# Patient Record
Sex: Female | Born: 1980 | Race: Black or African American | Hispanic: No | Marital: Single | Smoking: Never smoker
Health system: Southern US, Community
[De-identification: ages and names within clinical notes are randomized; demographics above are authoritative.]

## PROBLEM LIST (undated history)

## (undated) DIAGNOSIS — R7303 Prediabetes: Secondary | ICD-10-CM

## (undated) DIAGNOSIS — E559 Vitamin D deficiency, unspecified: Secondary | ICD-10-CM

## (undated) DIAGNOSIS — E119 Type 2 diabetes mellitus without complications: Secondary | ICD-10-CM

## (undated) DIAGNOSIS — E079 Disorder of thyroid, unspecified: Secondary | ICD-10-CM

## (undated) HISTORY — PX: ABDOMINAL HYSTERECTOMY: SHX81

---

## 2008-05-07 ENCOUNTER — Emergency Department (HOSPITAL_BASED_OUTPATIENT_CLINIC_OR_DEPARTMENT_OTHER): Admission: EM | Admit: 2008-05-07 | Discharge: 2008-05-07 | Payer: Self-pay | Admitting: Emergency Medicine

## 2009-01-01 ENCOUNTER — Emergency Department (HOSPITAL_BASED_OUTPATIENT_CLINIC_OR_DEPARTMENT_OTHER): Admission: EM | Admit: 2009-01-01 | Discharge: 2009-01-01 | Payer: Self-pay | Admitting: Emergency Medicine

## 2009-12-20 ENCOUNTER — Ambulatory Visit: Payer: Self-pay | Admitting: Diagnostic Radiology

## 2009-12-20 ENCOUNTER — Emergency Department (HOSPITAL_BASED_OUTPATIENT_CLINIC_OR_DEPARTMENT_OTHER): Admission: EM | Admit: 2009-12-20 | Discharge: 2009-12-20 | Payer: Self-pay | Admitting: Emergency Medicine

## 2009-12-29 ENCOUNTER — Emergency Department (HOSPITAL_BASED_OUTPATIENT_CLINIC_OR_DEPARTMENT_OTHER): Admission: EM | Admit: 2009-12-29 | Discharge: 2009-12-29 | Payer: Self-pay | Admitting: Emergency Medicine

## 2010-03-22 ENCOUNTER — Emergency Department (HOSPITAL_BASED_OUTPATIENT_CLINIC_OR_DEPARTMENT_OTHER)
Admission: EM | Admit: 2010-03-22 | Discharge: 2010-03-22 | Payer: Self-pay | Source: Home / Self Care | Admitting: Emergency Medicine

## 2010-06-14 LAB — RAPID STREP SCREEN (MED CTR MEBANE ONLY): Streptococcus, Group A Screen (Direct): NEGATIVE

## 2010-06-17 LAB — CBC
MCH: 25.3 pg — ABNORMAL LOW (ref 26.0–34.0)
MCHC: 32.7 g/dL (ref 30.0–36.0)
Platelets: 339 10*3/uL (ref 150–400)
RDW: 14.2 % (ref 11.5–15.5)

## 2010-06-17 LAB — URINALYSIS, ROUTINE W REFLEX MICROSCOPIC
Nitrite: NEGATIVE
Specific Gravity, Urine: 1.022 (ref 1.005–1.030)
Urobilinogen, UA: 1 mg/dL (ref 0.0–1.0)

## 2010-06-17 LAB — URINE MICROSCOPIC-ADD ON

## 2010-06-17 LAB — BASIC METABOLIC PANEL
CO2: 27 mEq/L (ref 19–32)
Calcium: 9.4 mg/dL (ref 8.4–10.5)
Creatinine, Ser: 0.9 mg/dL (ref 0.4–1.2)
Glucose, Bld: 100 mg/dL — ABNORMAL HIGH (ref 70–99)

## 2010-06-17 LAB — DIFFERENTIAL
Eosinophils Absolute: 0 10*3/uL (ref 0.0–0.7)
Monocytes Absolute: 0.8 10*3/uL (ref 0.1–1.0)
Neutrophils Relative %: 82 % — ABNORMAL HIGH (ref 43–77)

## 2010-06-17 LAB — PREGNANCY, URINE: Preg Test, Ur: NEGATIVE

## 2010-06-20 ENCOUNTER — Emergency Department (INDEPENDENT_AMBULATORY_CARE_PROVIDER_SITE_OTHER): Payer: Self-pay

## 2010-06-20 ENCOUNTER — Emergency Department (HOSPITAL_BASED_OUTPATIENT_CLINIC_OR_DEPARTMENT_OTHER)
Admission: EM | Admit: 2010-06-20 | Discharge: 2010-06-20 | Disposition: A | Discharge status: Home / Self Care | Payer: Self-pay | Attending: Emergency Medicine | Admitting: Emergency Medicine

## 2010-06-20 DIAGNOSIS — M7989 Other specified soft tissue disorders: Secondary | ICD-10-CM

## 2010-06-20 DIAGNOSIS — M79609 Pain in unspecified limb: Secondary | ICD-10-CM

## 2010-06-20 DIAGNOSIS — M25579 Pain in unspecified ankle and joints of unspecified foot: Secondary | ICD-10-CM | POA: Insufficient documentation

## 2010-07-09 LAB — TSH: TSH: 4.089 u[IU]/mL (ref 0.350–4.500)

## 2010-07-09 LAB — BASIC METABOLIC PANEL
CO2: 28 mEq/L (ref 19–32)
Calcium: 9.5 mg/dL (ref 8.4–10.5)
Creatinine, Ser: 0.8 mg/dL (ref 0.4–1.2)
GFR calc Af Amer: >60 mL/min (ref >60)

## 2010-07-09 LAB — POCT CARDIAC MARKERS: Myoglobin, poc: 41.4 ng/mL (ref 12–200)

## 2010-07-20 LAB — WET PREP, GENITAL
Clue Cells Wet Prep HPF POC: NONE SEEN
Trich, Wet Prep: NONE SEEN
WBC, Wet Prep HPF POC: NONE SEEN
Yeast Wet Prep HPF POC: NONE SEEN

## 2010-07-20 LAB — CBC
Hemoglobin: 11.1 g/dL — ABNORMAL LOW (ref 12.0–15.0)
Platelets: 359 10*3/uL (ref 150–400)
RDW: 14.2 % (ref 11.5–15.5)

## 2010-07-20 LAB — DIFFERENTIAL
Basophils Absolute: 0.1 10*3/uL (ref 0.0–0.1)
Lymphocytes Relative: 40 % (ref 12–46)
Neutro Abs: 4.3 10*3/uL (ref 1.7–7.7)
Neutrophils Relative %: 51 % (ref 43–77)

## 2010-07-20 LAB — URINALYSIS, ROUTINE W REFLEX MICROSCOPIC
Glucose, UA: NEGATIVE mg/dL
Ketones, ur: NEGATIVE mg/dL
pH: 6.5 (ref 5.0–8.0)

## 2010-07-20 LAB — URINE MICROSCOPIC-ADD ON

## 2011-03-31 ENCOUNTER — Encounter: Payer: Self-pay | Admitting: *Deleted

## 2011-03-31 DIAGNOSIS — X58XXXA Exposure to other specified factors, initial encounter: Secondary | ICD-10-CM | POA: Insufficient documentation

## 2011-03-31 DIAGNOSIS — IMO0002 Reserved for concepts with insufficient information to code with codable children: Secondary | ICD-10-CM | POA: Insufficient documentation

## 2011-03-31 DIAGNOSIS — R51 Headache: Secondary | ICD-10-CM | POA: Insufficient documentation

## 2011-03-31 DIAGNOSIS — R42 Dizziness and giddiness: Secondary | ICD-10-CM | POA: Insufficient documentation

## 2011-03-31 NOTE — ED Notes (Signed)
Pt presents to ED today with low back pain and dizziness.  Pt aslo c/o HA for the last 3 days

## 2011-04-01 ENCOUNTER — Emergency Department (HOSPITAL_BASED_OUTPATIENT_CLINIC_OR_DEPARTMENT_OTHER)
Admission: EM | Admit: 2011-04-01 | Discharge: 2011-04-01 | Disposition: A | Discharge status: Home / Self Care | Payer: Self-pay | Attending: Emergency Medicine | Admitting: Emergency Medicine

## 2011-04-01 DIAGNOSIS — S39012A Strain of muscle, fascia and tendon of lower back, initial encounter: Secondary | ICD-10-CM

## 2011-04-01 LAB — URINE MICROSCOPIC-ADD ON

## 2011-04-01 LAB — URINALYSIS, ROUTINE W REFLEX MICROSCOPIC
Bilirubin Urine: NEGATIVE
Glucose, UA: NEGATIVE mg/dL
Ketones, ur: NEGATIVE mg/dL
Specific Gravity, Urine: 1.024 (ref 1.005–1.030)
pH: 6 (ref 5.0–8.0)

## 2011-04-01 MED ORDER — HYDROCODONE-ACETAMINOPHEN 5-325 MG PO TABS: 2.0000 | ORAL_TABLET | ORAL | Status: AC | PRN

## 2011-04-01 MED ORDER — IBUPROFEN 800 MG PO TABS: 800.0000 mg | ORAL_TABLET | Freq: Three times a day (TID) | ORAL | Status: AC

## 2011-04-01 NOTE — ED Provider Notes (Signed)
History     CSN: [id number]  Arrival date & time 03/31/11  2325   First MD Initiated Contact with Patient 04/01/11 [phone]      Chief Complaint  Patient presents with  . Headache  . Back Pain    (Consider location/radiation/quality/duration/timing/severity/associated sxs/prior treatment) HPI Comments: Pt with 2 day hx of LBP, non-radiating, no neuro deficits.  No injury.  Earlier today, had frontal headache, has been off/on for 3 days.  Had some dizziness while taking a shower earlier today.  No dizziness, only mild headache now.  No recent illnesses, no fever.  No neck pain.  No CP/SOB  Patient is a 30 y.o. female presenting with back pain.  Back Pain  This is a new problem. The current episode started 2 days ago. The problem occurs constantly. The problem has been gradually worsening. The pain is associated with no known injury. The pain is present in the lumbar spine. The quality of the pain is described as shooting and aching. The pain does not radiate. The pain is moderate. The symptoms are aggravated by certain positions. Associated symptoms include headaches. Pertinent negatives include no chest pain, no fever, no numbness, no abdominal pain, no bowel incontinence, no bladder incontinence, no dysuria, no paresthesias, no tingling and no weakness.    History reviewed. No pertinent past medical history.  History reviewed. No pertinent past surgical history.  No family history on file.  History  Substance Use Topics  . Smoking status: Never Smoker   . Smokeless tobacco: Not on file  . Alcohol Use: No    OB History    Grav Para Term Preterm Abortions TAB SAB Ect Mult Living                  Review of Systems  Constitutional: Negative for fever, chills, diaphoresis and fatigue.  HENT: Negative for congestion, rhinorrhea and sneezing.   Eyes: Negative.   Respiratory: Negative for cough, chest tightness and shortness of breath.   Cardiovascular: Negative for chest pain  and leg swelling.  Gastrointestinal: Negative for nausea, vomiting, abdominal pain, diarrhea, blood in stool and bowel incontinence.  Genitourinary: Negative for bladder incontinence, dysuria, frequency, hematuria, flank pain and difficulty urinating.  Musculoskeletal: Positive for back pain. Negative for arthralgias.  Skin: Negative for rash.  Neurological: Positive for dizziness and headaches. Negative for tingling, speech difficulty, weakness, numbness and paresthesias.    Allergies  Review of patient's allergies indicates no known allergies.  Home Medications   Current Outpatient Rx  Name Route Sig Dispense Refill  . PROGESTERONE MICRONIZED 100 MG PO CAPS Oral Take 100 mg by mouth daily.      Marland Kitchen HYDROCODONE-ACETAMINOPHEN 5-325 MG PO TABS Oral Take 2 tablets by mouth every 4 (four) hours as needed for pain. 15 tablet 0  . IBUPROFEN 800 MG PO TABS Oral Take 1 tablet (800 mg total) by mouth 3 (three) times daily. 21 tablet 0    BP 122/73  Pulse 80  Temp(Src) 97.4 F (36.3 C) (Oral)  Resp 20  Ht 5' (1.524 m)  Wt 253 lb (114.76 kg)  BMI 49.41 kg/m2  SpO2 99%  Physical Exam  Constitutional: She is oriented to person, place, and time. She appears well-developed and well-nourished.  HENT:  Head: Normocephalic and atraumatic.  Eyes: Pupils are equal, round, and reactive to light.  Neck: Normal range of motion. Neck supple.  Cardiovascular: Normal rate, regular rhythm and normal heart sounds.   Pulmonary/Chest: Effort normal and breath sounds  normal. No respiratory distress. She has no wheezes. She has no rales. She exhibits no tenderness.  Abdominal: Soft. Bowel sounds are normal. There is no tenderness. There is no rebound and no guarding.  Musculoskeletal: Normal range of motion. She exhibits no edema.       Mild pain to musculature bilaterally in mid lumbar spine.  No pain along spine itself.  Neg SLR bilaterally.  NV intact  Lymphadenopathy:    She has no cervical adenopathy.    Neurological: She is alert and oriented to person, place, and time. She has normal strength. No cranial nerve deficit or sensory deficit. Coordination normal.  Skin: Skin is warm and dry. No rash noted.  Psychiatric: She has a normal mood and affect.    ED Course  Procedures (including critical care time)  Results for orders placed during the hospital encounter of 04/01/11  URINALYSIS, ROUTINE W REFLEX MICROSCOPIC      Component Value Range   Color, Urine YELLOW  YELLOW    APPearance CLEAR  CLEAR    Specific Gravity, Urine 1.024  1.005 - 1.030    pH 6.0  5.0 - 8.0    Glucose, UA NEGATIVE  NEGATIVE (mg/dL)   Hgb urine dipstick SMALL (*) NEGATIVE    Bilirubin Urine NEGATIVE  NEGATIVE    Ketones, ur NEGATIVE  NEGATIVE (mg/dL)   Protein, ur NEGATIVE  NEGATIVE (mg/dL)   Urobilinogen, UA 0.2  0.0 - 1.0 (mg/dL)   Nitrite NEGATIVE  NEGATIVE    Leukocytes, UA NEGATIVE  NEGATIVE   URINE MICROSCOPIC-ADD ON      Component Value Range   Squamous Epithelial / LPF FEW (*) RARE    WBC, UA 0-2  <3 (WBC/hpf)   RBC / HPF 0-2  <3 (RBC/hpf)   Bacteria, UA FEW (*) RARE   PREGNANCY, URINE      Component Value Range   Preg Test, Ur NEGATIVE     No results found.   1. Back strain       MDM  Pt with back pain, likely musculoskeletal,  No UTI.  No abd pain.  No significant headache/dizziness now.  Advised pt to f/u with PMD        Rolan Bucco, MD 04/01/11 [phone]

## 2012-04-19 DIAGNOSIS — C541 Malignant neoplasm of endometrium: Secondary | ICD-10-CM | POA: Insufficient documentation

## 2012-04-28 ENCOUNTER — Encounter (HOSPITAL_BASED_OUTPATIENT_CLINIC_OR_DEPARTMENT_OTHER): Payer: Self-pay | Admitting: *Deleted

## 2012-04-28 ENCOUNTER — Emergency Department (HOSPITAL_BASED_OUTPATIENT_CLINIC_OR_DEPARTMENT_OTHER)
Admission: EM | Admit: 2012-04-28 | Discharge: 2012-04-28 | Disposition: A | Discharge status: Home / Self Care | Payer: BC Managed Care – PPO | Attending: Emergency Medicine | Admitting: Emergency Medicine

## 2012-04-28 DIAGNOSIS — Z90710 Acquired absence of both cervix and uterus: Secondary | ICD-10-CM | POA: Insufficient documentation

## 2012-04-28 DIAGNOSIS — R1032 Left lower quadrant pain: Secondary | ICD-10-CM | POA: Insufficient documentation

## 2012-04-28 DIAGNOSIS — M793 Panniculitis, unspecified: Secondary | ICD-10-CM | POA: Insufficient documentation

## 2012-04-28 DIAGNOSIS — R51 Headache: Secondary | ICD-10-CM | POA: Insufficient documentation

## 2012-04-28 LAB — CBC WITH DIFFERENTIAL/PLATELET
Basophils Absolute: 0 10*3/uL (ref 0.0–0.1)
Lymphocytes Relative: 11 % — ABNORMAL LOW (ref 12–46)
Lymphs Abs: 1.6 10*3/uL (ref 0.7–4.0)
MCV: 78.9 fL (ref 78.0–100.0)
Neutro Abs: 12.2 10*3/uL — ABNORMAL HIGH (ref 1.7–7.7)
Neutrophils Relative %: 85 % — ABNORMAL HIGH (ref 43–77)
Platelets: 338 10*3/uL (ref 150–400)
RBC: 4.13 MIL/uL (ref 3.87–5.11)
RDW: 15.6 % — ABNORMAL HIGH (ref 11.5–15.5)
WBC: 14.4 10*3/uL — ABNORMAL HIGH (ref 4.0–10.5)

## 2012-04-28 LAB — BASIC METABOLIC PANEL
CO2: 25 mEq/L (ref 19–32)
Calcium: 9.3 mg/dL (ref 8.4–10.5)
Chloride: 101 mEq/L (ref 96–112)
Glucose, Bld: 109 mg/dL — ABNORMAL HIGH (ref 70–99)
Potassium: 3.4 mEq/L — ABNORMAL LOW (ref 3.5–5.1)
Sodium: 139 mEq/L (ref 135–145)

## 2012-04-28 MED ORDER — MORPHINE SULFATE 4 MG/ML IJ SOLN
4.0000 mg | Freq: Once | INTRAMUSCULAR | Status: AC
  Administered 2012-04-28: 4 mg via INTRAVENOUS
  Filled 2012-04-28: qty 1

## 2012-04-28 MED ORDER — SODIUM CHLORIDE 0.9 % IV SOLN
Freq: Once | INTRAVENOUS | Status: AC
  Administered 2012-04-28: 17:00:00 via INTRAVENOUS

## 2012-04-28 MED ORDER — CLINDAMYCIN HCL 150 MG PO CAPS: 300.0000 mg | ORAL_CAPSULE | Freq: Three times a day (TID) | ORAL | Status: DC

## 2012-04-28 MED ORDER — CLINDAMYCIN PHOSPHATE 900 MG/50ML IV SOLN
900.0000 mg | Freq: Once | INTRAVENOUS | Status: AC
  Administered 2012-04-28: 900 mg via INTRAVENOUS
  Filled 2012-04-28: qty 50

## 2012-04-28 MED ORDER — KETOROLAC TROMETHAMINE 30 MG/ML IJ SOLN
30.0000 mg | Freq: Once | INTRAMUSCULAR | Status: AC
  Administered 2012-04-28: 30 mg via INTRAVENOUS
  Filled 2012-04-28: qty 1

## 2012-04-28 MED ORDER — OXYCODONE-ACETAMINOPHEN 5-325 MG PO TABS: 2.0000 | ORAL_TABLET | ORAL | Status: DC | PRN

## 2012-04-28 MED ORDER — ACETAMINOPHEN 500 MG PO TABS
1000.0000 mg | ORAL_TABLET | Freq: Once | ORAL | Status: AC
  Administered 2012-04-28: 1000 mg via ORAL
  Filled 2012-04-28: qty 2

## 2012-04-28 MED ORDER — SODIUM CHLORIDE 0.9 % IV BOLUS (SEPSIS)
1000.0000 mL | Freq: Once | INTRAVENOUS | Status: AC
  Administered 2012-04-28: 1000 mL via INTRAVENOUS

## 2012-04-28 NOTE — ED Notes (Signed)
Pt states that she has had a hysterectomy, provider notified.

## 2012-04-28 NOTE — ED Provider Notes (Signed)
Medical screening examination/treatment/procedure(s) were conducted as a shared visit with non-physician practitioner(s) and myself.  I personally evaluated the patient during the encounter Pt with lower abd cellulitis.  Nontoxic appearing.  IV abx given, start PO abx, return 1-2 days for recheck   Rolan Bucco, MD 04/28/12 2001

## 2012-04-28 NOTE — ED Notes (Signed)
The urine has not been collected at this time. The patient stated that she is unable to urinate, she knows that we need the sample. She will get the sample as soon as she is able.

## 2012-04-28 NOTE — ED Notes (Addendum)
Pt states she was at work today and they took her temp and it was 101.8. Did not take anything for fever. Also c/o H/A and cold chills. Also reports low abd pain. "That's how it started."

## 2012-04-28 NOTE — ED Notes (Signed)
Pt reports she is still unable to give urine specimen at this time. Will let me know when able to void to assist to bathroom.

## 2012-04-28 NOTE — ED Notes (Signed)
Pt tolerated graham crackers and ginger ale well. Pt c/o HA 7/10. Will notify [place] PA.

## 2012-04-28 NOTE — ED Provider Notes (Signed)
History     CSN: [id number]  Arrival date & time 04/28/12  1412   First MD Initiated Contact with Patient 04/28/12 1433      Chief Complaint  Patient presents with  . Fever    (Consider location/radiation/quality/duration/timing/severity/associated sxs/prior treatment) HPI Comments: Patient is a 32 year old female who presents with a 1 week history of abdominal pain. The pain is located in her LLQ and does not radiate. The pain is described as aching and severe. The pain started gradually and progressively worsened since the onset which became acutely worse today. No alleviating/aggravating factors. The patient has tried nothing for symptoms without relief. Associated symptoms include fever, chills, and headache. Patient denies fever, headache, NVD, chest pain, SOB, dysuria, constipation, abnormal vaginal bleeding/discharge. Patient has a surgical history of total hysterectomy.      History reviewed. No pertinent past medical history.  Past Surgical History  Procedure Date  . Abdominal hysterectomy     History reviewed. No pertinent family history.  History  Substance Use Topics  . Smoking status: Never Smoker   . Smokeless tobacco: Not on file  . Alcohol Use: No    OB History    Grav Para Term Preterm Abortions TAB SAB Ect Mult Living                  Review of Systems  Constitutional: Positive for fever and chills.  Gastrointestinal: Positive for abdominal pain.  Neurological: Positive for headaches.  All other systems reviewed and are negative.    Allergies  Review of patient's allergies indicates no known allergies.  Home Medications   Current Outpatient Rx  Name  Route  Sig  Dispense  Refill  . PROGESTERONE MICRONIZED 100 MG PO CAPS   Oral   Take 100 mg by mouth daily.             BP 132/65  Pulse 116  Temp 101.7 F (38.7 C) (Oral)  Resp 16  Ht 5' (1.524 m)  Wt 283 lb (128.368 kg)  BMI 55.27 kg/m2  SpO2 97%  Physical Exam  Nursing note  and vitals reviewed. Constitutional: She is oriented to person, place, and time. She appears well-developed and well-nourished. No distress.  HENT:  Head: Normocephalic and atraumatic.  Eyes: Conjunctivae normal and EOM are normal. Pupils are equal, round, and reactive to light. No scleral icterus.  Neck: Normal range of motion. Neck supple.  Cardiovascular: Normal rate and regular rhythm.  Exam reveals no gallop and no friction rub.   No murmur heard. Pulmonary/Chest: Effort normal and breath sounds normal. She has no wheezes. She has no rales. She exhibits no tenderness.  Abdominal: Soft. She exhibits no distension. There is tenderness. There is no rebound and no guarding.       LLQ tenderness to palpation. No peritoneal signs.   Musculoskeletal: Normal range of motion.  Neurological: She is alert and oriented to person, place, and time. Coordination normal.       Speech is goal-oriented. Moves limbs without ataxia.   Skin: Skin is warm and dry.          Erythematous and warm area of left lower pannus. This area is tender to palpation.   Psychiatric: She has a normal mood and affect. Her behavior is normal.    ED Course  Procedures (including critical care time)  Labs Reviewed  CBC WITH DIFFERENTIAL - Abnormal; Notable for the following:    WBC 14.4 (*)     Hemoglobin  10.2 (*)     HCT 32.6 (*)     MCH 24.7 (*)     RDW 15.6 (*)     Neutrophils Relative 85 (*)     Neutro Abs 12.2 (*)     Lymphocytes Relative 11 (*)     All other components within normal limits  BASIC METABOLIC PANEL - Abnormal; Notable for the following:    Potassium 3.4 (*)     Glucose, Bld 109 (*)     GFR calc non Af Amer 85 (*)     All other components within normal limits  URINALYSIS, ROUTINE W REFLEX MICROSCOPIC   No results found.   1. Panniculitis       MDM  2:59 PM Labs and urinalysis pending. Patient will have fluids and morphine.   4:27 PM Labs show elevated WBC. Patient likely has  panniculitis. I will treat her with IV Clindamycin and discharge her with PO Clindamycin and pain medication. Patient reports this feels like when she last has panniculitis.    6:40 PM Patient feeling better after IV antibiotic. Patient will be discharged with PO Clindamycin and percocet for pain. Vitals stable for discharge. Patient instructed to return with worsening or concerning symptoms.     Emilia Beck, PA-C 04/28/12 1844

## 2012-04-29 NOTE — ED Notes (Signed)
Pt called and stated that she wanted something cheaper than clindamycin.  Spoke to Dr. Fredderick Phenix.  Called in Bactrim DS, one tab, BID x 10 days to [facility] on [address].  [phone].

## 2012-05-31 ENCOUNTER — Emergency Department (HOSPITAL_BASED_OUTPATIENT_CLINIC_OR_DEPARTMENT_OTHER)
Admission: EM | Admit: 2012-05-31 | Discharge: 2012-05-31 | Disposition: A | Discharge status: Home / Self Care | Payer: BC Managed Care – PPO | Attending: Emergency Medicine | Admitting: Emergency Medicine

## 2012-05-31 ENCOUNTER — Encounter (HOSPITAL_BASED_OUTPATIENT_CLINIC_OR_DEPARTMENT_OTHER): Payer: Self-pay

## 2012-05-31 DIAGNOSIS — Z79899 Other long term (current) drug therapy: Secondary | ICD-10-CM | POA: Insufficient documentation

## 2012-05-31 DIAGNOSIS — R1310 Dysphagia, unspecified: Secondary | ICD-10-CM | POA: Insufficient documentation

## 2012-05-31 DIAGNOSIS — R51 Headache: Secondary | ICD-10-CM | POA: Insufficient documentation

## 2012-05-31 DIAGNOSIS — J020 Streptococcal pharyngitis: Secondary | ICD-10-CM | POA: Insufficient documentation

## 2012-05-31 LAB — RAPID STREP SCREEN (MED CTR MEBANE ONLY): Streptococcus, Group A Screen (Direct): POSITIVE — AB

## 2012-05-31 MED ORDER — BENZOCAINE 20 % MT SOLN
OROMUCOSAL | Status: AC
  Administered 2012-05-31: 09:00:00
  Filled 2012-05-31: qty 57

## 2012-05-31 MED ORDER — HYDROCODONE-ACETAMINOPHEN 7.5-500 MG/15ML PO SOLN: 15.0000 mL | Freq: Four times a day (QID) | ORAL | Status: DC | PRN

## 2012-05-31 MED ORDER — PENICILLIN G BENZATHINE 1200000 UNIT/2ML IM SUSP
1.2000 10*6.[IU] | Freq: Once | INTRAMUSCULAR | Status: AC
  Administered 2012-05-31: 1.2 10*6.[IU] via INTRAMUSCULAR
  Filled 2012-05-31: qty 2

## 2012-05-31 MED ORDER — DEXAMETHASONE 4 MG PO TABS
10.0000 mg | ORAL_TABLET | Freq: Once | ORAL | Status: AC
  Administered 2012-05-31: 10 mg via ORAL
  Filled 2012-05-31: qty 3

## 2012-05-31 MED ORDER — MENTHOL 3 MG MT LOZG
1.0000 | LOZENGE | Freq: Once | OROMUCOSAL | Status: DC
Start: 2012-05-31 — End: 2012-05-31
  Filled 2012-05-31: qty 9

## 2012-05-31 MED ORDER — IBUPROFEN 400 MG PO TABS
600.0000 mg | ORAL_TABLET | Freq: Once | ORAL | Status: AC
  Administered 2012-05-31: 600 mg via ORAL
  Filled 2012-05-31: qty 1

## 2012-05-31 NOTE — ED Provider Notes (Signed)
I saw and evaluated the patient, reviewed the resident's note and I agree with the findings and plan. Patient presenting with pharyngitis-type symptoms. Lymphadenopathy present. Patient does not display any signs of extremities. No concern at this point for her peritonsillar abscess, retropharyngeal abscess or epiglottitis. Patient is tolerating by mouth's and in no acute distress. Strep screen is positive and she was treated with Decadron and penicillin  Gwyneth Sprout, MD 05/31/12 1000

## 2012-05-31 NOTE — ED Notes (Signed)
Pt reports a sore throat that started yesterday.

## 2012-05-31 NOTE — ED Provider Notes (Signed)
History     CSN: [id number]  Arrival date & time 05/31/12  0812   First MD Initiated Contact with Patient 05/31/12 [phone]      Chief Complaint  Patient presents with  . Sore Throat    HPI Comments: 32 y.o PMH endometriosis presents with sore throat and difficulty swallowing since yesterday.  She also has some swollen lymph glands in her neck.  She tried Ibuprofen for pain which helped a little.  She has not been able to eat or drink today.  She also has a 8/10 h/a today.    [facility]: abdominal hysterectomy SH: works at a dialysis clinic  The history is provided by the patient. No language interpreter was used.    History reviewed. No pertinent past medical history.  Past Surgical History  Procedure Laterality Date  . Abdominal hysterectomy      No family history on file.  History  Substance Use Topics  . Smoking status: Never Smoker   . Smokeless tobacco: Not on file  . Alcohol Use: No    OB History   Grav Para Term Preterm Abortions TAB SAB Ect Mult Living                  Review of Systems  Constitutional: Negative for fever and chills.  HENT: Positive for sore throat and trouble swallowing.   Respiratory: Negative for shortness of breath.   Gastrointestinal: Negative for abdominal pain and diarrhea.  Genitourinary: Negative for vaginal discharge.       Denies odor.   Neurological: Positive for headaches.  All other systems reviewed and are negative.    Allergies  Sulfa antibiotics  Home Medications   Current Outpatient Rx  Name  Route  Sig  Dispense  Refill  . HYDROcodone-acetaminophen (LORTAB) 7.5-500 MG/15ML solution   Oral   Take 15 mLs by mouth every 6 (six) hours as needed for pain.   120 mL   0   . progesterone (PROMETRIUM) 100 MG capsule   Oral   Take 100 mg by mouth daily.             BP 124/99  Pulse 78  Temp(Src) 97.9 F (36.6 C) (Oral)  Resp 18  Ht 5' (1.524 m)  Wt 280 lb (127.007 kg)  BMI 54.68 kg/m2  SpO2 97%  Physical  Exam  Nursing note and vitals reviewed. Constitutional: She is oriented to person, place, and time. Vital signs are normal. She appears well-developed and well-nourished. She is cooperative. No distress.  HENT:  Head: Normocephalic and atraumatic.  Mouth/Throat: Oropharynx is clear and moist and mucous membranes are normal. No oropharyngeal exudate.  Erythema and inflammation to tonsils, uvula, throat   Eyes: Conjunctivae are normal. Pupils are equal, round, and reactive to light. Right eye exhibits no discharge. Left eye exhibits no discharge. No scleral icterus.  Cardiovascular: Normal rate, regular rhythm, S1 normal, S2 normal and normal heart sounds.   No murmur heard. Pulmonary/Chest: Effort normal and breath sounds normal. No respiratory distress. She has no wheezes.  Abdominal: Soft. Normal appearance and bowel sounds are normal. There is no tenderness.  Obese abdomen  Lymphadenopathy:       Head (right side): Tonsillar and posterior auricular adenopathy present.       Head (left side): Tonsillar and posterior auricular adenopathy present.    She has cervical adenopathy.       Right cervical: Superficial cervical adenopathy present.       Left cervical: Superficial cervical  adenopathy present.  Neurological: She is alert and oriented to person, place, and time. Gait normal.  Skin: Skin is warm, dry and intact. No rash noted. She is not diaphoretic.  Psychiatric: She has a normal mood and affect. Her speech is normal and behavior is normal. Judgment and thought content normal. Cognition and memory are normal.    ED Course  Procedures (including critical care time)  Labs Reviewed  RAPID STREP SCREEN   No results found.   1. Sore throat   2. Strep pharyngitis       MDM  Ddx viral or bacterial  Hurricane spray Rapid strep Bicillin 1.2 mil units im x 1  Lorab elixer  F/u PCP 1 week  Shirlee Latch MD [phone]        Annett Gula, MD 05/31/12 8295  Annett Gula,  MD 05/31/12 [phone]

## 2012-05-31 NOTE — ED Notes (Signed)
MD at bedside. 

## 2012-06-18 ENCOUNTER — Emergency Department (HOSPITAL_BASED_OUTPATIENT_CLINIC_OR_DEPARTMENT_OTHER)
Admission: EM | Admit: 2012-06-18 | Discharge: 2012-06-18 | Disposition: A | Discharge status: Home / Self Care | Payer: BC Managed Care – PPO | Attending: Emergency Medicine | Admitting: Emergency Medicine

## 2012-06-18 ENCOUNTER — Emergency Department (HOSPITAL_BASED_OUTPATIENT_CLINIC_OR_DEPARTMENT_OTHER): Payer: BC Managed Care – PPO

## 2012-06-18 ENCOUNTER — Encounter (HOSPITAL_BASED_OUTPATIENT_CLINIC_OR_DEPARTMENT_OTHER): Payer: Self-pay | Admitting: *Deleted

## 2012-06-18 DIAGNOSIS — J189 Pneumonia, unspecified organism: Secondary | ICD-10-CM | POA: Insufficient documentation

## 2012-06-18 DIAGNOSIS — Z79899 Other long term (current) drug therapy: Secondary | ICD-10-CM | POA: Insufficient documentation

## 2012-06-18 DIAGNOSIS — IMO0001 Reserved for inherently not codable concepts without codable children: Secondary | ICD-10-CM | POA: Insufficient documentation

## 2012-06-18 DIAGNOSIS — J029 Acute pharyngitis, unspecified: Secondary | ICD-10-CM | POA: Insufficient documentation

## 2012-06-18 DIAGNOSIS — R059 Cough, unspecified: Secondary | ICD-10-CM | POA: Insufficient documentation

## 2012-06-18 LAB — RAPID STREP SCREEN (MED CTR MEBANE ONLY): Streptococcus, Group A Screen (Direct): NEGATIVE

## 2012-06-18 MED ORDER — DEXTROMETHORPHAN POLISTIREX 30 MG/5ML PO LQCR: 60.0000 mg | ORAL | Status: DC | PRN

## 2012-06-18 MED ORDER — LEVOFLOXACIN 500 MG PO TABS: 500.0000 mg | ORAL_TABLET | Freq: Every day | ORAL | Status: DC

## 2012-06-18 NOTE — ED Notes (Signed)
Woke with a fever. Cough, sore throat, aching all over and feels weak. She was treated for Strep 3 weeks ago. Never felt like she got over it.

## 2012-06-18 NOTE — ED Provider Notes (Signed)
History     CSN: [id number]  Arrival date & time 06/18/12  1242   First MD Initiated Contact with Patient 06/18/12 1303      Chief Complaint  Patient presents with  . Fever    (Consider location/radiation/quality/duration/timing/severity/associated sxs/prior treatment) HPI Comments: Patient is a 32 year old female with no significant past medical history who presents with a 3 week history of productive cough. Symptoms started gradually and progressively worsened since the onset. Patient reports being treated for strep throat 3 weeks ago but has continued to have productive cough with associated symptoms including fever up to 101 at home and myalgias. Patient has tried robitussin at home which has not helped. No aggravating/alleviating factors.   Patient is a 32 y.o. female presenting with fever.  Fever Associated symptoms: cough, myalgias and sore throat     History reviewed. No pertinent past medical history.  Past Surgical History  Procedure Laterality Date  . Abdominal hysterectomy      No family history on file.  History  Substance Use Topics  . Smoking status: Never Smoker   . Smokeless tobacco: Not on file  . Alcohol Use: No    OB History   Grav Para Term Preterm Abortions TAB SAB Ect Mult Living                  Review of Systems  Constitutional: Positive for fever.  HENT: Positive for sore throat.   Respiratory: Positive for cough.   Musculoskeletal: Positive for myalgias.  All other systems reviewed and are negative.    Allergies  Sulfa antibiotics  Home Medications   Current Outpatient Rx  Name  Route  Sig  Dispense  Refill  . HYDROcodone-acetaminophen (LORTAB) 7.5-500 MG/15ML solution   Oral   Take 15 mLs by mouth every 6 (six) hours as needed for pain.   120 mL   0   . progesterone (PROMETRIUM) 100 MG capsule   Oral   Take 100 mg by mouth daily.             BP 122/93  Pulse 88  Temp(Src) 98.7 F (37.1 C) (Oral)  Resp 20  Ht 5'  (1.524 m)  Wt 273 lb (123.832 kg)  BMI 53.32 kg/m2  SpO2 96%  Physical Exam  Nursing note and vitals reviewed. Constitutional: She is oriented to person, place, and time. She appears well-developed and well-nourished. No distress.  HENT:  Head: Normocephalic and atraumatic.  Mouth/Throat: Oropharynx is clear and moist. No oropharyngeal exudate.  Frontal and maxillary sinus tenderness to palpation.   Eyes: Conjunctivae and EOM are normal.  Neck: Normal range of motion. Neck supple.  Cardiovascular: Normal rate and regular rhythm.  Exam reveals no gallop and no friction rub.   No murmur heard. Pulmonary/Chest: Effort normal. She has wheezes. She has no rales. She exhibits no tenderness.  Rhonchi and wheezes noted to right upper lobe. Occasional rhonchi noted to remaining bilateral lung fields.   Abdominal: Soft. There is no tenderness.  Musculoskeletal: Normal range of motion.  Lymphadenopathy:    She has no cervical adenopathy.  Neurological: She is alert and oriented to person, place, and time. Coordination normal.  Speech is goal-oriented. Moves limbs without ataxia.   Skin: Skin is warm and dry.  Psychiatric: She has a normal mood and affect. Her behavior is normal.    ED Course  Procedures (including critical care time)  Labs Reviewed  RAPID STREP SCREEN   Dg Chest 2 View  06/18/2012  *RADIOLOGY REPORT*  Clinical Data: Fever, cough, congestion  CHEST - 2 VIEW  Comparison: 12/20/2009  Findings: Faint increased nodular opacity in the right upper lobe compared to the prior study, suspicious for developing mild pneumonia.  Left lung clear.  No edema, effusion or pneumothorax. Normal heart size and vascularity.  IMPRESSION: Faint increased opacity right upper lobe concerning for pneumonia   Original Report Authenticated By: Judie Petit. Shick, M.D.      1. Community acquired pneumonia       MDM  1:43 PM Rapid strep test negative. Patient's chest xray pending. Patient afebrile with  stable vitals at this time.   2:15 PM Chest xray shows RUL pneumonia. I will treat the patient with levaquin for CAP. Patient is afebrile with stable vitals. No further evaluation needed at this time. Patient instructed to return with worsening or concerning symptoms.       Emilia Beck, PA-C 06/18/12 1418

## 2012-06-18 NOTE — ED Provider Notes (Signed)
Medical screening examination/treatment/procedure(s) were performed by non-physician practitioner and as supervising physician I was immediately available for consultation/collaboration.   Gwyneth Sprout, MD 06/18/12 1505

## 2013-10-14 ENCOUNTER — Emergency Department (HOSPITAL_BASED_OUTPATIENT_CLINIC_OR_DEPARTMENT_OTHER): Payer: BC Managed Care – PPO

## 2013-10-14 ENCOUNTER — Emergency Department (HOSPITAL_BASED_OUTPATIENT_CLINIC_OR_DEPARTMENT_OTHER)
Admission: EM | Admit: 2013-10-14 | Discharge: 2013-10-14 | Disposition: A | Discharge status: Home / Self Care | Payer: BC Managed Care – PPO | Attending: Emergency Medicine | Admitting: Emergency Medicine

## 2013-10-14 ENCOUNTER — Encounter (HOSPITAL_BASED_OUTPATIENT_CLINIC_OR_DEPARTMENT_OTHER): Payer: Self-pay | Admitting: Emergency Medicine

## 2013-10-14 DIAGNOSIS — R071 Chest pain on breathing: Secondary | ICD-10-CM | POA: Insufficient documentation

## 2013-10-14 DIAGNOSIS — E079 Disorder of thyroid, unspecified: Secondary | ICD-10-CM | POA: Diagnosis not present

## 2013-10-14 DIAGNOSIS — Z79899 Other long term (current) drug therapy: Secondary | ICD-10-CM | POA: Insufficient documentation

## 2013-10-14 DIAGNOSIS — R079 Chest pain, unspecified: Secondary | ICD-10-CM | POA: Diagnosis present

## 2013-10-14 DIAGNOSIS — R0789 Other chest pain: Secondary | ICD-10-CM

## 2013-10-14 DIAGNOSIS — E559 Vitamin D deficiency, unspecified: Secondary | ICD-10-CM | POA: Insufficient documentation

## 2013-10-14 HISTORY — DX: Disorder of thyroid, unspecified: E07.9

## 2013-10-14 HISTORY — DX: Vitamin D deficiency, unspecified: E55.9

## 2013-10-14 HISTORY — DX: Prediabetes: R73.03

## 2013-10-14 LAB — CBC WITH DIFFERENTIAL/PLATELET
BASOS ABS: 0 10*3/uL (ref 0.0–0.1)
Basophils Relative: 0 % (ref 0–1)
EOS ABS: 0.2 10*3/uL (ref 0.0–0.7)
EOS PCT: 2 % (ref 0–5)
HCT: 35.7 % — ABNORMAL LOW (ref 36.0–46.0)
HEMOGLOBIN: 10.9 g/dL — AB (ref 12.0–15.0)
LYMPHS PCT: 37 % (ref 12–46)
Lymphs Abs: 3.3 10*3/uL (ref 0.7–4.0)
MCH: 24.5 pg — ABNORMAL LOW (ref 26.0–34.0)
MCHC: 30.5 g/dL (ref 30.0–36.0)
MCV: 80.4 fL (ref 78.0–100.0)
Monocytes Absolute: 0.5 10*3/uL (ref 0.1–1.0)
Monocytes Relative: 6 % (ref 3–12)
NEUTROS PCT: 56 % (ref 43–77)
Neutro Abs: 5.1 10*3/uL (ref 1.7–7.7)
PLATELETS: 380 10*3/uL (ref 150–400)
RBC: 4.44 MIL/uL (ref 3.87–5.11)
RDW: 16 % — AB (ref 11.5–15.5)
WBC: 9.1 10*3/uL (ref 4.0–10.5)

## 2013-10-14 LAB — BASIC METABOLIC PANEL
ANION GAP: 12 (ref 5–15)
BUN: 14 mg/dL (ref 6–23)
CALCIUM: 9.5 mg/dL (ref 8.4–10.5)
CO2: 27 meq/L (ref 19–32)
Chloride: 102 mEq/L (ref 96–112)
Creatinine, Ser: 0.7 mg/dL (ref 0.50–1.10)
GFR calc Af Amer: >90 mL/min (ref >90)
Glucose, Bld: 112 mg/dL — ABNORMAL HIGH (ref 70–99)
POTASSIUM: 4.2 meq/L (ref 3.7–5.3)
SODIUM: 141 meq/L (ref 137–147)

## 2013-10-14 LAB — D-DIMER, QUANTITATIVE: D-Dimer, Quant: <0.27 ug/mL-FEU (ref 0.00–0.48)

## 2013-10-14 LAB — TROPONIN I: Troponin I: <0.3 ng/mL (ref <0.30)

## 2013-10-14 MED ORDER — IBUPROFEN 600 MG PO TABS: 600.0000 mg | ORAL_TABLET | Freq: Four times a day (QID) | ORAL | Status: DC | PRN

## 2013-10-14 MED ORDER — KETOROLAC TROMETHAMINE 30 MG/ML IJ SOLN
30.0000 mg | Freq: Once | INTRAMUSCULAR | Status: AC
  Administered 2013-10-14: 30 mg via INTRAVENOUS
  Filled 2013-10-14: qty 1

## 2013-10-14 MED ORDER — METAXALONE 800 MG PO TABS: 800.0000 mg | ORAL_TABLET | Freq: Three times a day (TID) | ORAL | Status: DC

## 2013-10-14 MED ORDER — MORPHINE SULFATE 4 MG/ML IJ SOLN
4.0000 mg | Freq: Once | INTRAMUSCULAR | Status: AC
  Administered 2013-10-14: 4 mg via INTRAVENOUS
  Filled 2013-10-14: qty 1

## 2013-10-14 MED ORDER — LORAZEPAM 2 MG/ML IJ SOLN
0.5000 mg | Freq: Once | INTRAMUSCULAR | Status: AC
  Administered 2013-10-14: 0.5 mg via INTRAVENOUS
  Filled 2013-10-14: qty 1

## 2013-10-14 MED ORDER — SODIUM CHLORIDE 0.9 % IV SOLN
INTRAVENOUS | Status: DC
  Administered 2013-10-14: 15 mL/h via INTRAVENOUS

## 2013-10-14 NOTE — ED Provider Notes (Signed)
CSN: [id number]     Arrival date & time 10/14/13  0741 History   First MD Initiated Contact with Patient 10/14/13 0801     Chief Complaint  Patient presents with  . Chest Pain     (Consider location/radiation/quality/duration/timing/severity/associated sxs/prior Treatment) Patient is a 33 y.o. female presenting with chest pain. The history is provided by the patient.  Chest Pain  patient here complaining of right-sided chest pain which is been constant since yesterday. Pain characterized as sharp and sometimes worse with taking deep breath. Denies any palpitations or dyspnea. No diaphoresis. No associated nausea. Symptoms are not worse with ambulation. Denies any radiation of her discomfort. Denies any syncope or near-syncope. No leg pain or swelling. Symptoms persisted and no treatment used prior to arrival. Denies any rashes to her chest. No prior history of same.  Past Medical History  Diagnosis Date  . Borderline diabetes   . Vitamin D deficiency   . Thyroid disease    Past Surgical History  Procedure Laterality Date  . Abdominal hysterectomy     No family history on file. History  Substance Use Topics  . Smoking status: Never Smoker   . Smokeless tobacco: Not on file  . Alcohol Use: No   OB History   Grav Para Term Preterm Abortions TAB SAB Ect Mult Living                 Review of Systems  Cardiovascular: Positive for chest pain.  All other systems reviewed and are negative.     Allergies  Sulfa antibiotics  Home Medications   Prior to Admission medications   Medication Sig Start Date End Date Taking? Authorizing Provider  LEVOTHYROXINE SODIUM PO Take by mouth.   Yes Historical Provider, MD  Vitamin D, Ergocalciferol, (DRISDOL) 50000 UNITS CAPS capsule Take 50,000 Units by mouth every 7 (seven) days.   Yes Historical Provider, MD  dextromethorphan (DELSYM) 30 MG/5ML liquid Take 10 mLs (60 mg total) by mouth as needed for cough. 06/18/12   Kaitlyn Szekalski,  PA-C  HYDROcodone-acetaminophen (LORTAB) 7.5-500 MG/15ML solution Take 15 mLs by mouth every 6 (six) hours as needed for pain. 05/31/12   Cresenciano Genre, MD  levofloxacin (LEVAQUIN) 500 MG tablet Take 1 tablet (500 mg total) by mouth daily. 06/18/12   Kaitlyn Szekalski, PA-C  progesterone (PROMETRIUM) 100 MG capsule Take 100 mg by mouth daily.      Historical Provider, MD   BP 135/82  Pulse 83  Temp(Src) 98.6 F (37 C) (Oral)  Resp 18  Ht 5' (1.524 m)  SpO2 100% Physical Exam  Nursing note and vitals reviewed. Constitutional: She is oriented to person, place, and time. She appears well-developed and well-nourished.  Non-toxic appearance. No distress.  HENT:  Head: Normocephalic and atraumatic.  Eyes: Conjunctivae, EOM and lids are normal. Pupils are equal, round, and reactive to light.  Neck: Normal range of motion. Neck supple. No tracheal deviation present. No mass present.  Cardiovascular: Normal rate, regular rhythm and normal heart sounds.  Exam reveals no gallop.   No murmur heard. Pulmonary/Chest: Effort normal and breath sounds normal. No stridor. No respiratory distress. She has no decreased breath sounds. She has no wheezes. She has no rhonchi. She has no rales.  Abdominal: Soft. Normal appearance and bowel sounds are normal. She exhibits no distension. There is no tenderness. There is no rebound and no CVA tenderness.  Musculoskeletal: Normal range of motion. She exhibits no edema and no tenderness.  Neurological: She  is alert and oriented to person, place, and time. She has normal strength. No cranial nerve deficit or sensory deficit. GCS eye subscore is 4. GCS verbal subscore is 5. GCS motor subscore is 6.  Skin: Skin is warm and dry. No abrasion and no rash noted.  Psychiatric: She has a normal mood and affect. Her speech is normal and behavior is normal.    ED Course  Procedures (including critical care time) Labs Review Labs Reviewed  D-DIMER, QUANTITATIVE  TROPONIN I   CBC WITH DIFFERENTIAL  BASIC METABOLIC PANEL    Imaging Review No results found.   EKG Interpretation   Date/Time:  Monday October 14 2013 07:50:17 EDT Ventricular Rate:  83 PR Interval:  138 QRS Duration: 82 QT Interval:  388 QTC Calculation: 455 R Axis:   55 Text Interpretation:  Normal sinus rhythm Normal ECG No significant change  since last tracing Confirmed by Greenleigh Kauth  MD, Darrow Barreiro ([id number]) on 10/14/2013  8:09:16 AM      MDM   Final diagnoses:  None      Patient given meds here and feels better. D-dimer and troponin negative. Patient's pain is reproducible to palpation on her right anterior chest wall. I do not think that this represents ACS or PE. Stable for discharge  Leota Jacobsen, MD 10/14/13 1121

## 2013-10-14 NOTE — Discharge Instructions (Signed)

## 2013-10-14 NOTE — ED Notes (Signed)
Onset of midsternal chest pain that started last night at 8pm while ambulating and lasted until midnight.  Awakened her this morning at 0330 with same pain.  Pain is described as sharp, aching pain 7/10 now.

## 2015-08-20 IMAGING — CR DG CHEST 2V
2 series · 2 of 2 positions shown · non-contrast
Comparison: 06/18/2012

CLINICAL DATA: 32-year-old female with chest pain.

EXAM:
CHEST  2 VIEW

[w chest pa]
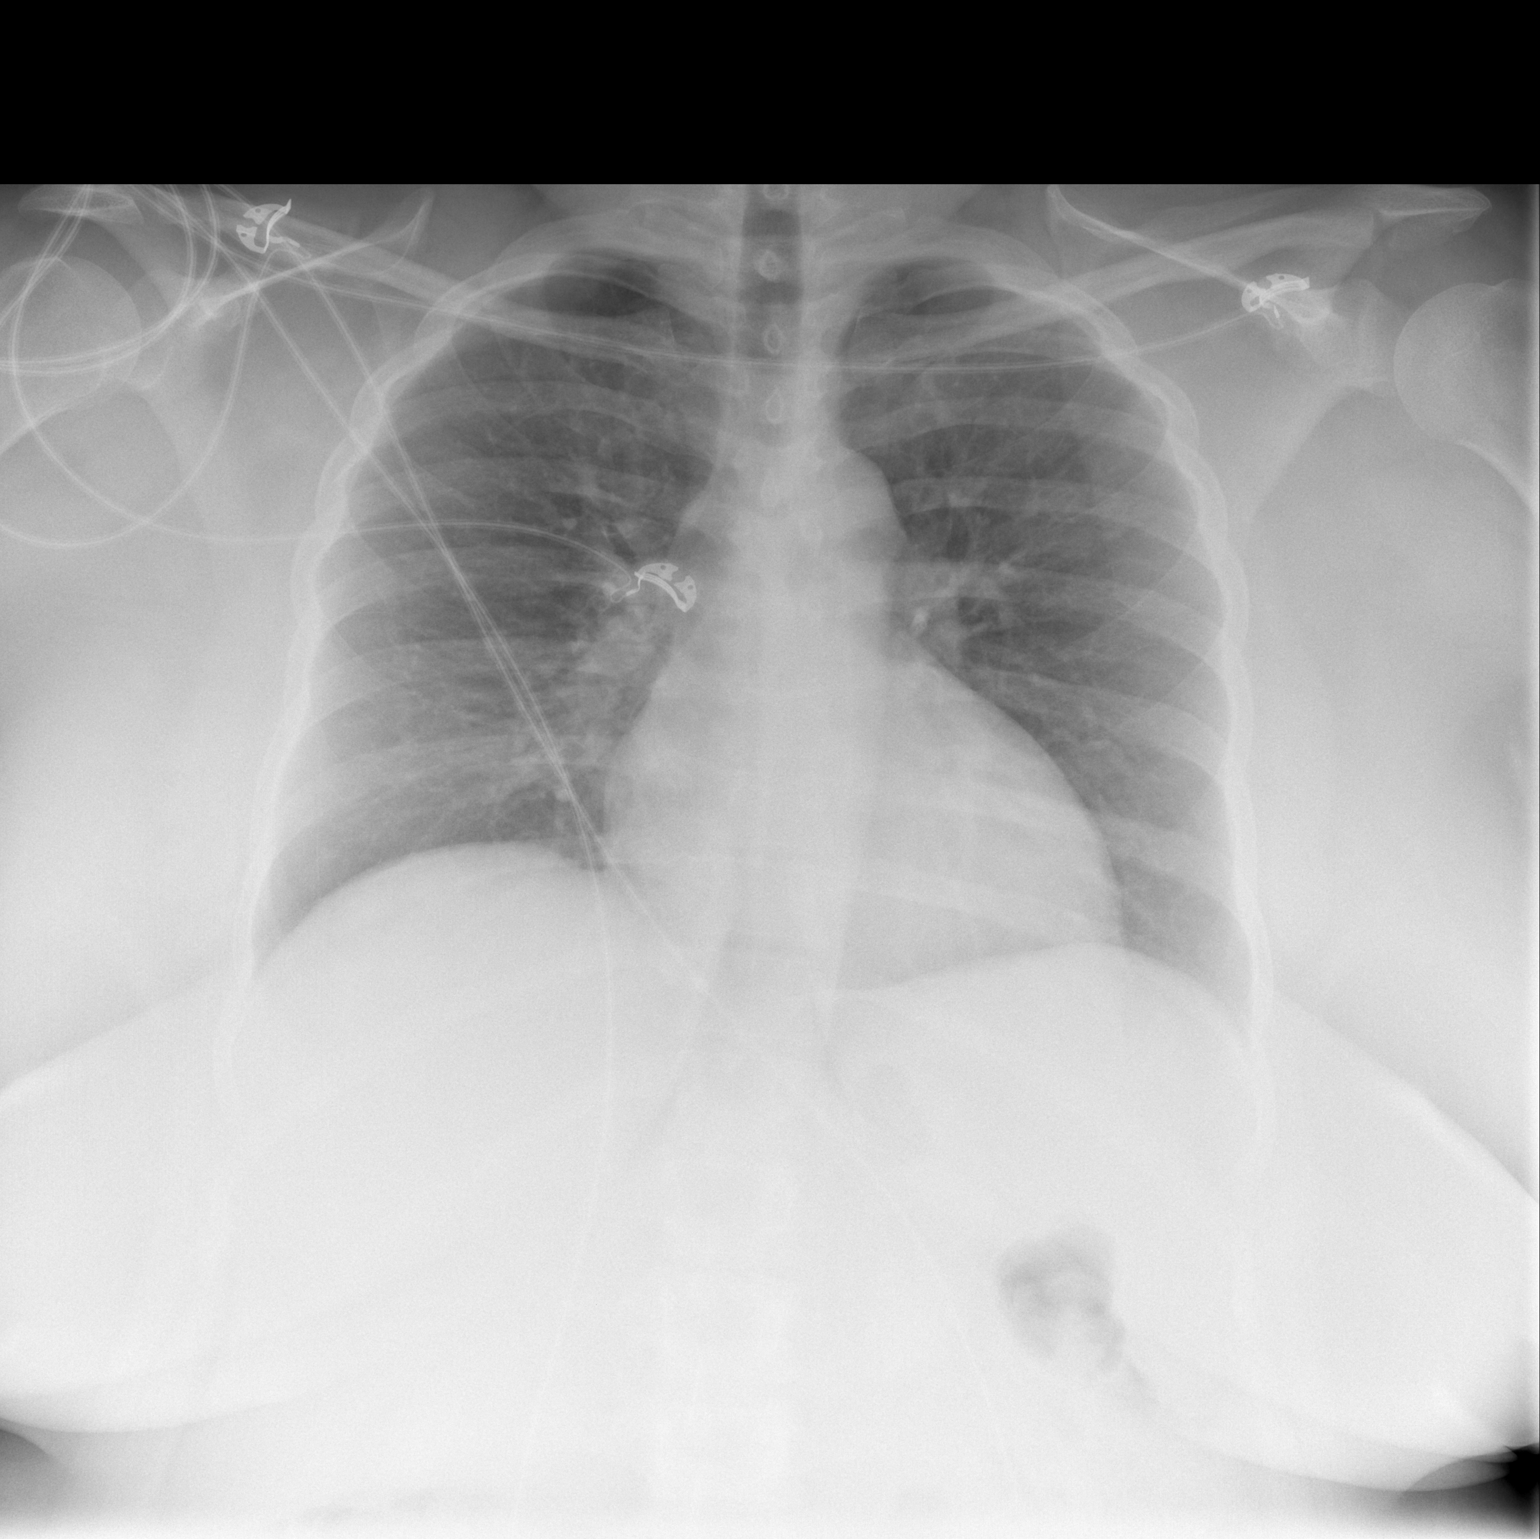

[w chest lat]
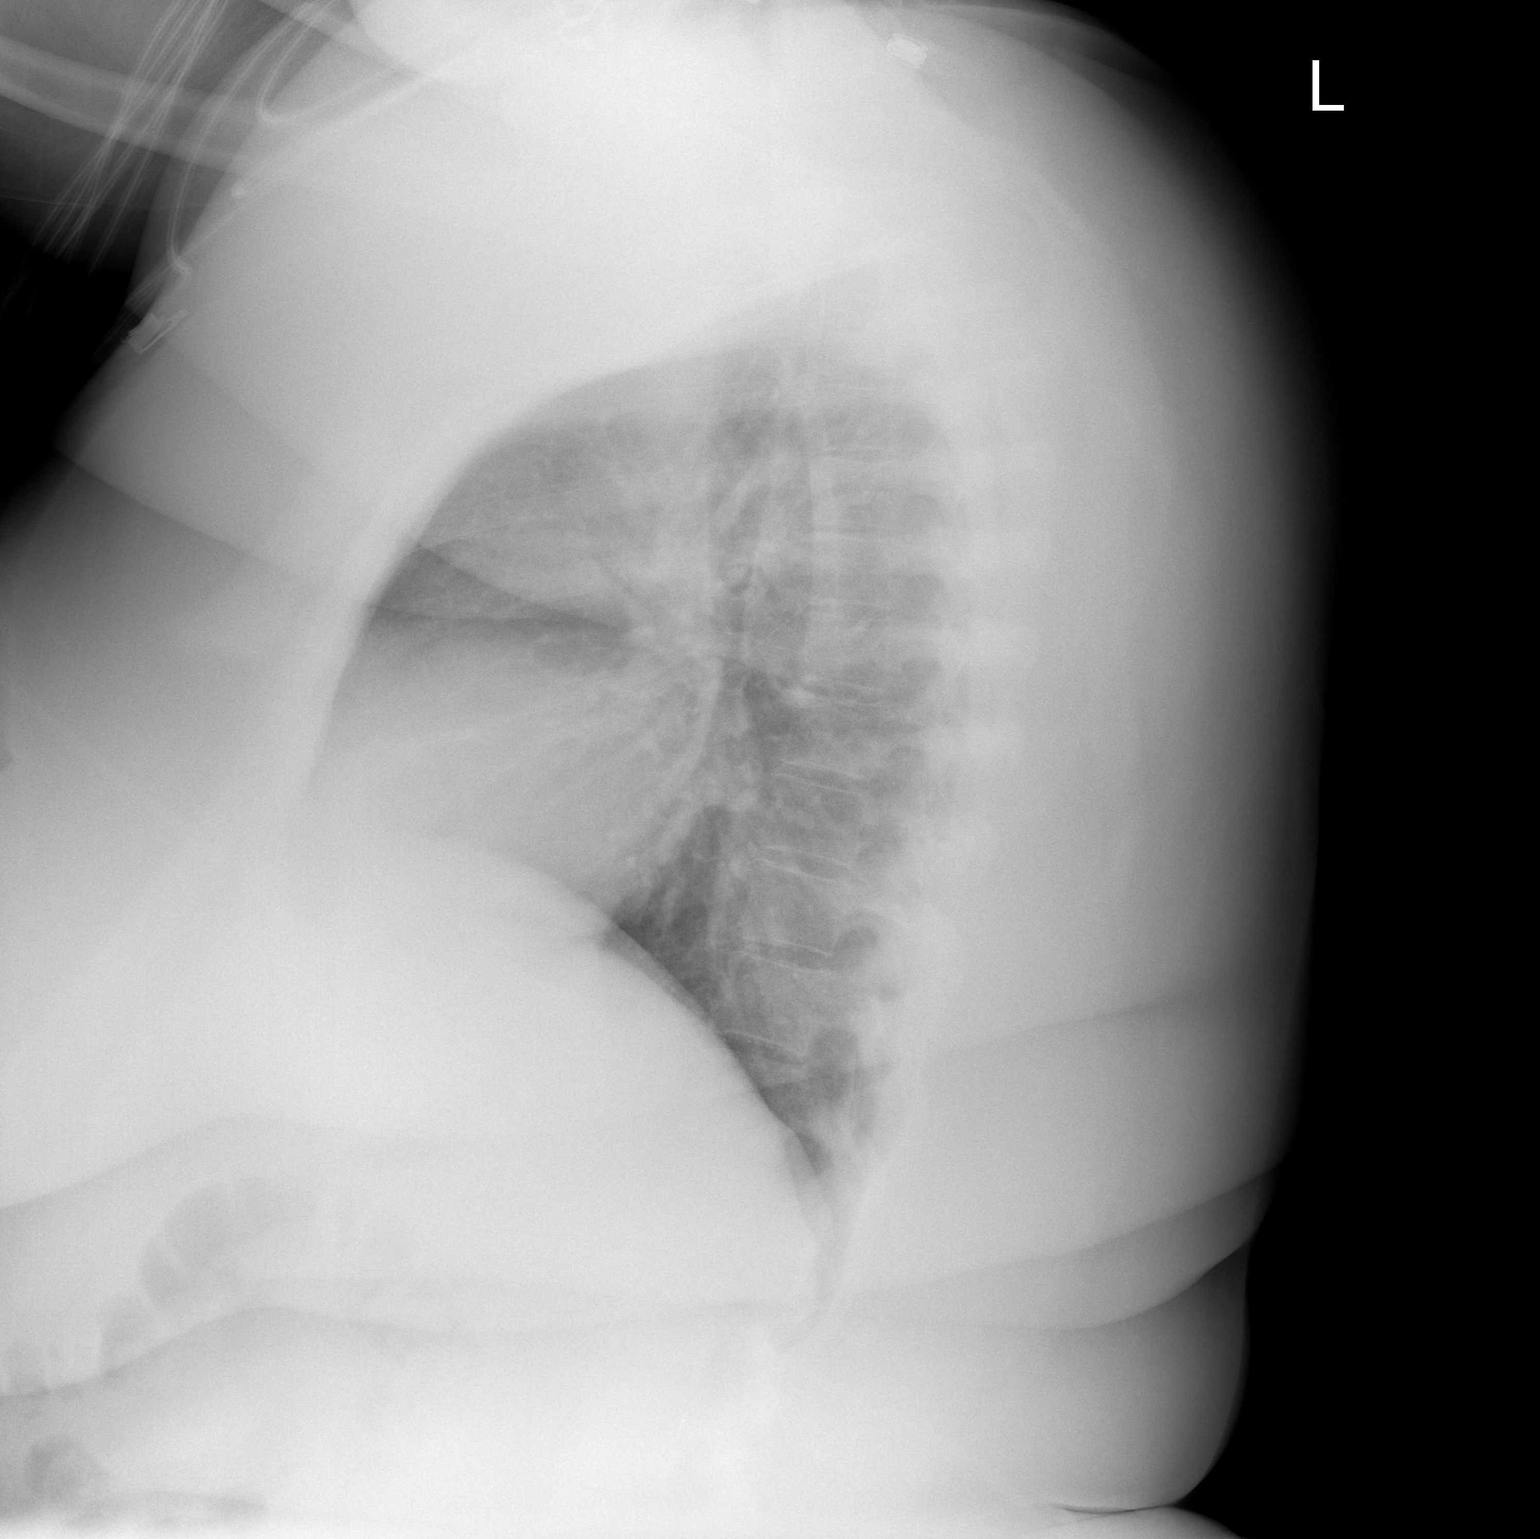

[2 of 2 positions shown; findings below may reference images not displayed]

FINDINGS: The cardiomediastinal silhouette is unremarkable.

Mild peribronchial thickening is unchanged.

There is no evidence of focal airspace disease, pulmonary edema,
suspicious pulmonary nodule/mass, pleural effusion, or pneumothorax.
No acute bony abnormalities are identified.
IMPRESSION: No active cardiopulmonary disease.

## 2016-01-22 ENCOUNTER — Encounter (HOSPITAL_BASED_OUTPATIENT_CLINIC_OR_DEPARTMENT_OTHER): Payer: Self-pay | Admitting: *Deleted

## 2016-01-22 ENCOUNTER — Emergency Department (HOSPITAL_BASED_OUTPATIENT_CLINIC_OR_DEPARTMENT_OTHER): Payer: BLUE CROSS/BLUE SHIELD

## 2016-01-22 ENCOUNTER — Emergency Department (HOSPITAL_BASED_OUTPATIENT_CLINIC_OR_DEPARTMENT_OTHER)
Admission: EM | Admit: 2016-01-22 | Discharge: 2016-01-22 | Disposition: A | Discharge status: Home / Self Care | Payer: BLUE CROSS/BLUE SHIELD | Attending: Emergency Medicine | Admitting: Emergency Medicine

## 2016-01-22 DIAGNOSIS — G8929 Other chronic pain: Secondary | ICD-10-CM

## 2016-01-22 DIAGNOSIS — M545 Low back pain, unspecified: Secondary | ICD-10-CM

## 2016-01-22 DIAGNOSIS — M79602 Pain in left arm: Secondary | ICD-10-CM

## 2016-01-22 DIAGNOSIS — S199XXA Unspecified injury of neck, initial encounter: Secondary | ICD-10-CM | POA: Diagnosis present

## 2016-01-22 DIAGNOSIS — Y92410 Unspecified street and highway as the place of occurrence of the external cause: Secondary | ICD-10-CM | POA: Diagnosis not present

## 2016-01-22 DIAGNOSIS — M25552 Pain in left hip: Secondary | ICD-10-CM | POA: Diagnosis not present

## 2016-01-22 DIAGNOSIS — S161XXA Strain of muscle, fascia and tendon at neck level, initial encounter: Secondary | ICD-10-CM | POA: Diagnosis not present

## 2016-01-22 DIAGNOSIS — Y999 Unspecified external cause status: Secondary | ICD-10-CM | POA: Insufficient documentation

## 2016-01-22 DIAGNOSIS — Y9389 Activity, other specified: Secondary | ICD-10-CM | POA: Insufficient documentation

## 2016-01-22 LAB — PREGNANCY, URINE: Preg Test, Ur: NEGATIVE

## 2016-01-22 MED ORDER — IBUPROFEN 600 MG PO TABS: 600.0000 mg | ORAL_TABLET | Freq: Four times a day (QID) | ORAL | 0 refills | Status: DC | PRN

## 2016-01-22 MED ORDER — METHOCARBAMOL 500 MG PO TABS: 500.0000 mg | ORAL_TABLET | Freq: Two times a day (BID) | ORAL | 0 refills | Status: DC

## 2016-01-22 MED ORDER — OXYCODONE-ACETAMINOPHEN 5-325 MG PO TABS
1.0000 | ORAL_TABLET | Freq: Once | ORAL | Status: AC
  Administered 2016-01-22: 1 via ORAL
  Filled 2016-01-22: qty 1

## 2016-01-22 NOTE — ED Notes (Signed)
Pt returned from CT and xray. 

## 2016-01-22 NOTE — ED Notes (Signed)
Pt verbalizes understanding of d/c instructions and denies any further need at this time. 

## 2016-01-22 NOTE — ED Notes (Signed)
Pt. Sleeping during assessment   RN having to wake Pt. To speak with her about pain and body issues / reason she is here.
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
## 2016-01-22 NOTE — ED Notes (Signed)
Patient transported to CT and xray 

## 2016-01-22 NOTE — Discharge Instructions (Signed)
Your images have been normal today. There was a possible fracture of your left hip one. I have given you a referral to Dr. Barbaraann Barthel with ortho. You make take the ibuprofen and robaxin for pain. Use a heating pad on your neck. I have given you  a referral to a primary care doctor if the pain and numbness continues you may to need follow up with with them for further imaging. Return to the ED if your symptoms worsen.
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
## 2016-01-25 ENCOUNTER — Encounter: Payer: Self-pay | Admitting: Family Medicine

## 2016-01-25 ENCOUNTER — Ambulatory Visit (INDEPENDENT_AMBULATORY_CARE_PROVIDER_SITE_OTHER): Payer: BLUE CROSS/BLUE SHIELD | Admitting: Family Medicine

## 2016-01-25 VITALS — BP 133/93 | HR 90 | Ht 60.0 in | Wt 291.0 lb

## 2016-01-25 DIAGNOSIS — M542 Cervicalgia: Secondary | ICD-10-CM

## 2016-01-25 DIAGNOSIS — M545 Low back pain, unspecified: Secondary | ICD-10-CM

## 2016-01-25 DIAGNOSIS — M79605 Pain in left leg: Secondary | ICD-10-CM

## 2016-01-25 DIAGNOSIS — M25559 Pain in unspecified hip: Secondary | ICD-10-CM

## 2016-01-25 MED ORDER — METHOCARBAMOL 500 MG PO TABS: 500.0000 mg | ORAL_TABLET | Freq: Three times a day (TID) | ORAL | 1 refills | Status: DC | PRN

## 2016-01-25 NOTE — Patient Instructions (Signed)
You have a lumbar strain and hip pointer (bruise of the hip bone). Consider tylenol for baseline pain relief (1-2 extra strength tabs 3x/day) Ibuprofen 600mg  three times a day with food for pain and inflammation. Robaxin as needed for muscle spasms (no driving on this medicine). Stay as active as possible. Start physical therapy Do home exercises and stretches on days you don't go to therapy. Strengthening of low back muscles, abdominal musculature are key for long term pain relief. If not improving, will consider further imaging (MRI). Follow up with me in 1 month.  You have either a nerve stretch injury or a herniated disc pinching a nerve going into your left arm. Both are treated similarly initially. Ibuprofen, robaxin, and physical therapy as noted above. Can consider prednisone, MRI if not improving as expected at follow up in 1 month.

## 2016-01-28 ENCOUNTER — Telehealth: Payer: Self-pay | Admitting: Family Medicine

## 2016-01-28 DIAGNOSIS — M545 Low back pain: Secondary | ICD-10-CM | POA: Insufficient documentation

## 2016-01-28 DIAGNOSIS — M542 Cervicalgia: Secondary | ICD-10-CM | POA: Insufficient documentation

## 2016-01-28 DIAGNOSIS — M79605 Pain in left leg: Secondary | ICD-10-CM | POA: Insufficient documentation
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
## 2016-01-28 NOTE — Assessment & Plan Note (Signed)
consistent with nerve stretch injury or herniated disc.  Ibuprofen, robaxin, and physical therapy.  Consider prednisone, MRI if not improving in 1 month.

## 2016-01-28 NOTE — Assessment & Plan Note (Signed)
Independently reviewed radiographs of hip and spine - I do not see clear evidence of a greater trochanter avulsion - if present this is nondisplaced and should heal with conservative treatment.  Pain 2/2 hip pointer and lumbar strain.  Start with tylenol, ibuprofen with robaxin as needed for spasms.  Start physical therapy and home exercise program.  F/u in 1 month.  Consider lumbar spine MRI if not improving.

## 2016-01-28 NOTE — Telephone Encounter (Signed)
So there are a couple options here - we could try a different anti-inflammatory like diclofenac and/or a single course of a pain medication like hydrocodone.    If the outside of her hip is bothering her crutches may help as well to take some pressure off of here.

## 2016-01-29 MED ORDER — DICLOFENAC SODIUM 75 MG PO TBEC: 75.0000 mg | DELAYED_RELEASE_TABLET | Freq: Two times a day (BID) | ORAL | 1 refills | Status: DC

## 2016-01-29 NOTE — Telephone Encounter (Signed)
Spoke to patient and told her medication was sent in. The letter should contain , limits on standing, bending and lifting with more frequent breaks.

## 2016-01-29 NOTE — Telephone Encounter (Signed)
I sent the medicine in.  Light duty can mean multiple things - are we talking about lifting limitations?  Limiting bending, crawling, stooping?  More frequent breaks?  I think light duty is reasonable but we have to write the specific parameters.

## 2016-01-29 NOTE — Telephone Encounter (Signed)
Spoke to patient and gave her information. Patient stated she would like to try the diclofenac and send it to [facility] on [address]. Patient stated that she did not work yesterday due to the pain, at work today, however, would like a letter for work stating light duty.

## 2016-02-01 ENCOUNTER — Ambulatory Visit: Payer: BLUE CROSS/BLUE SHIELD

## 2016-02-01 NOTE — Telephone Encounter (Signed)
Letter printed.

## 2016-02-02 ENCOUNTER — Ambulatory Visit: Payer: BLUE CROSS/BLUE SHIELD | Attending: Family Medicine | Admitting: Physical Therapy

## 2016-02-02 DIAGNOSIS — M6281 Muscle weakness (generalized): Secondary | ICD-10-CM | POA: Diagnosis present

## 2016-02-02 DIAGNOSIS — M545 Low back pain, unspecified: Secondary | ICD-10-CM

## 2016-02-02 DIAGNOSIS — R293 Abnormal posture: Secondary | ICD-10-CM
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
## 2016-02-02 NOTE — Telephone Encounter (Signed)
Letter placed up front  

## 2016-02-04 ENCOUNTER — Ambulatory Visit: Payer: BLUE CROSS/BLUE SHIELD

## 2016-02-09 ENCOUNTER — Ambulatory Visit: Payer: BLUE CROSS/BLUE SHIELD | Attending: Family Medicine

## 2016-02-09 DIAGNOSIS — M6281 Muscle weakness (generalized): Secondary | ICD-10-CM

## 2016-02-09 DIAGNOSIS — R293 Abnormal posture: Secondary | ICD-10-CM | POA: Insufficient documentation

## 2016-02-09 DIAGNOSIS — M545 Low back pain, unspecified: Secondary | ICD-10-CM

## 2016-02-09 NOTE — Therapy (Signed)
[facility] [address]  [place], [place], [address] Phone: [phone]   Fax:  [phone]  Physical Therapy Treatment  Patient Details  Name: Katie Lynn MRN: [id number] Date of Birth: [birth year] Referring Provider: Dr. Karlton Lemon  Encounter Date: 02/09/2016      PT End of Session - 02/09/16 1536    Visit Number 2   Number of Visits 12   Date for PT Re-Evaluation 03/15/16   PT Start Time [id number]   PT Stop Time 1612   PT Time Calculation (min) 41 min   Activity Tolerance Patient tolerated treatment well   Behavior During Therapy [facility] for tasks assessed/performed      Past Medical History:  Diagnosis Date  . Borderline diabetes   . Thyroid disease   . Vitamin D deficiency     Past Surgical History:  Procedure Laterality Date  . ABDOMINAL HYSTERECTOMY      There were no vitals filed for this visit.      Subjective Assessment - 02/09/16 1533    Subjective Noting benefit from E-stim last treatment.  Pt. reporting bending at work is most difficult activity at this point.  Pt. still on, "light duty".   Patient Stated Goals improve pain, back to work full duty   Currently in Pain? Yes   Pain Score 7    Pain Location Hip   Pain Orientation Left   Pain Descriptors / Indicators Aching   Pain Type Acute pain   Pain Onset 1 to 4 weeks ago   Pain Frequency Constant   Multiple Pain Sites No       Today's treatment:  Therex (pt. Unable to tolerate supine lying activities today): NuStep: lvl 4, 5 min   Manual: R sidelying STM with foam roller to piriformis, glute med x 5 min  R sidelying TPR with red med ball (1000Gr) to glute med, piriformis x 1 min; very poorly tolerated today thus terminated R sidelying HS, piriformis stretch x 1 min each; pt. pain up to 9/10 with both  Modalities: Moist heat to L hip: R-sidelying with L LE on bolster, 10 min           PT Education - 02/09/16 1823     Education provided Yes   Education Details Seated HS stretch   Person(s) Educated Patient   Methods Explanation;Tactile cues;Verbal cues;Handout   Comprehension Verbalized understanding;Need further instruction;Returned demonstration             PT Long Term Goals - 02/09/16 1538      PT LONG TERM GOAL #1   Title indpendent with HEP (03/15/16)   Time 6   Period Weeks   Status On-going     PT LONG TERM GOAL #2   Title report ability to sit and stand > 30 min for improved work tolerance and function (03/15/16)   Time 6   Period Weeks   Status On-going     PT LONG TERM GOAL #3   Title improve lumbar flexion to > 45 degrees without increase in pain for improved function (03/15/16)   Time 6   Period Weeks   Status On-going     PT LONG TERM GOAL #4   Title verbalize understanding of posture/body mechanics to decrease risk of reinjury (03/15/16)   Time 6   Period Weeks   Status On-going               Plan - 02/09/16 1538  Clinical Impression Statement Pt. with poor tolerance for supine stretching and R hip STM today.  Pt. R hip pain remaining high throughout treatment limiting LE stretching and HEP creation.  Pt. with significant pain increase in all position with motion.  All stretching performed in R sidelying with very limited ROM.  Majority of treatment with pain relief focus. Will plan to add more flexibility activities to HEP as pt. tolerates.      PT Treatment/Interventions ADLs/Self Care Home Management;Cryotherapy;Electrical Stimulation;Iontophoresis 4mg /ml Dexamethasone;Moist Heat;Ultrasound;Traction;Therapeutic exercise;Therapeutic activities;Functional mobility training;Stair training;Gait training;Patient/family education;Manual techniques   PT Next Visit Plan Review HEP for flexibility, strengthening, core strengthening (add as pt. tolerates due to very poor tolerance for supine/sidelying/seated stretching strengthening activity), modalities PRN       Patient will benefit from skilled therapeutic intervention in order to improve the following deficits and impairments:  Pain, Difficulty walking, Decreased mobility, Decreased strength, Decreased range of motion, Postural dysfunction, Impaired flexibility, Obesity, Decreased activity tolerance  Visit Diagnosis: Acute left-sided low back pain without sciatica  Abnormal posture  Muscle weakness (generalized)     Problem List Patient Active Problem List   Diagnosis Date Noted  . Low back pain radiating to left leg 01/28/2016  . Neck pain 01/28/2016  . Endometrial cancer ([place]) 04/19/2012    Bess Harvest, PTA 02/10/16 1:11 PM   [facility] [address]  [place], [place], [address] Phone: [phone]   Fax:  [phone]  Name: Katie Lynn MRN: [id number] Date of Birth: 1981-01-14

## 2016-02-11 ENCOUNTER — Ambulatory Visit: Payer: BLUE CROSS/BLUE SHIELD | Admitting: Physical Therapy

## 2016-02-11 DIAGNOSIS — M545 Low back pain, unspecified: Secondary | ICD-10-CM

## 2016-02-11 DIAGNOSIS — R293 Abnormal posture: Secondary | ICD-10-CM

## 2016-02-11 DIAGNOSIS — M6281 Muscle weakness (generalized): Secondary | ICD-10-CM

## 2016-02-11 NOTE — Therapy (Signed)
[facility] [address]  [place], [place], [address] Phone: [phone]   Fax:  [phone]  Physical Therapy Treatment  Patient Details  Name: Katie Lynn MRN: [id number] Date of Birth: May 13, 1980 Referring Provider: Dr. Karlton Lemon  Encounter Date: 02/11/2016      PT End of Session - 02/11/16 1555    Visit Number 3   Number of Visits 12   Date for PT Re-Evaluation 03/15/16   PT Start Time 1520   PT Stop Time 1607   PT Time Calculation (min) 47 min   Activity Tolerance Patient tolerated treatment well   Behavior During Therapy [facility] for tasks assessed/performed      Past Medical History:  Diagnosis Date  . Borderline diabetes   . Thyroid disease   . Vitamin D deficiency     Past Surgical History:  Procedure Laterality Date  . ABDOMINAL HYSTERECTOMY      There were no vitals filed for this visit.      Subjective Assessment - 02/11/16 1520    Subjective "today is better" - has been off/working 1/2 days - reduction in activity   Pertinent History borderline DM, endometrial cancer (not active)   Limitations Lifting;Standing;Walking;Sitting;House hold activities   How long can you sit comfortably? 20 min   How long can you stand comfortably? 10 min   How long can you walk comfortably? ~ 30 min   Diagnostic tests x ray: ?L hip avulsion fx   Patient Stated Goals improve pain, back to work full duty   Currently in Pain? Yes   Pain Score 4    Pain Location Hip   Pain Orientation Left   Pain Descriptors / Indicators Dull   Pain Type Acute pain   Pain Onset 1 to 4 weeks ago   Pain Frequency Constant   Aggravating Factors  prolonged standing and sitting, lifting   Pain Relieving Factors sitting, rest, muscle relaxer                         [facility] Adult PT Treatment/Exercise - 02/11/16 1526      Exercises   Exercises Knee/Hip     Knee/Hip Exercises: Stretches   Passive  Hamstring Stretch Limitations HEP review; increased presuure in low back when in supine     Knee/Hip Exercises: Aerobic   Nustep L3 x 77min     Knee/Hip Exercises: Standing   Heel Raises Both;10 reps   Hip Flexion Left;15 reps   Hip Flexion Limitations yellow tband   Hip ADduction Left;15 reps   Hip ADduction Limitations yellow tband   Hip Abduction Left;15 reps   Abduction Limitations yellow tband   Functional Squat 10 reps   Functional Squat Limitations at counter; small range     Knee/Hip Exercises: Supine   Bridges Both;10 reps     Knee/Hip Exercises: Sidelying   Hip ABduction Left;15 reps   Clams L x 15 reps     Modalities   Modalities Electrical Stimulation;Moist Heat     Moist Heat Therapy   Number Minutes Moist Heat 15 Minutes   Moist Heat Location Hip     Electrical Stimulation   Electrical Stimulation Location L buttock   Electrical Stimulation Action IFC   Electrical Stimulation Parameters to tolerance   Electrical Stimulation Goals Pain                     PT Long Term  Goals - 02/09/16 1538      PT LONG TERM GOAL #1   Title indpendent with HEP (03/15/16)   Time 6   Period Weeks   Status On-going     PT LONG TERM GOAL #2   Title report ability to sit and stand > 30 min for improved work tolerance and function (03/15/16)   Time 6   Period Weeks   Status On-going     PT LONG TERM GOAL #3   Title improve lumbar flexion to > 45 degrees without increase in pain for improved function (03/15/16)   Time 6   Period Weeks   Status On-going     PT LONG TERM GOAL #4   Title verbalize understanding of posture/body mechanics to decrease risk of reinjury (03/15/16)   Time 6   Period Weeks   Status On-going               Plan - 02/11/16 1612    Clinical Impression Statement Patient today with improved subjective reports of pain, which she attributes to reduced activity. Patient today able to initiate some hip strengthening in supine,  sidelying, and standing with little to no pain increase. Patient repsonding well to estim to L buttock region with moist heat. Patient to conitnue to beenfit from skilled PT to improve overall function and pain.    PT Treatment/Interventions ADLs/Self Care Home Management;Cryotherapy;Electrical Stimulation;Iontophoresis 4mg /ml Dexamethasone;Moist Heat;Ultrasound;Traction;Therapeutic exercise;Therapeutic activities;Functional mobility training;Stair training;Gait training;Patient/family education;Manual techniques   PT Next Visit Plan progress hip strengthening to tolerance   Consulted and Agree with Plan of Care Patient      Patient will benefit from skilled therapeutic intervention in order to improve the following deficits and impairments:  Pain, Difficulty walking, Decreased mobility, Decreased strength, Decreased range of motion, Postural dysfunction, Impaired flexibility, Obesity, Decreased activity tolerance  Visit Diagnosis: Acute left-sided low back pain without sciatica  Abnormal posture  Muscle weakness (generalized)     Problem List Patient Active Problem List   Diagnosis Date Noted  . Low back pain radiating to left leg 01/28/2016  . Neck pain 01/28/2016  . Endometrial cancer ([place]) 04/19/2012      Lanney Gins, PT, DPT 02/11/16 4:21 PM    [facility] [address]  [place], [place], [address] Phone: [phone]   Fax:  [phone]  Name: Katie Lynn MRN: [id number] Date of Birth: [birth year]

## 2016-02-16 ENCOUNTER — Ambulatory Visit: Payer: BLUE CROSS/BLUE SHIELD | Admitting: Physical Therapy

## 2016-02-17 ENCOUNTER — Ambulatory Visit: Payer: BLUE CROSS/BLUE SHIELD | Admitting: Physical Therapy

## 2016-02-17 DIAGNOSIS — M6281 Muscle weakness (generalized): Secondary | ICD-10-CM

## 2016-02-17 DIAGNOSIS — R293 Abnormal posture: Secondary | ICD-10-CM

## 2016-02-17 DIAGNOSIS — M545 Low back pain, unspecified: Secondary | ICD-10-CM

## 2016-02-17 NOTE — Therapy (Addendum)
[facility] [address]  [place], [place], [address] Phone: [phone]   Fax:  [phone]  Physical Therapy Treatment  Patient Details  Name: Anoushka Divito MRN: [id number] Date of Birth: Jan 07, 1981 Referring Provider: Dr. Karlton Lemon  Encounter Date: 02/17/2016      PT End of Session - 02/17/16 1528    Visit Number 4   Number of Visits 12   Date for PT Re-Evaluation 03/15/16   PT Start Time 1525   PT Stop Time 1620   PT Time Calculation (min) 55 min   Activity Tolerance Patient tolerated treatment well   Behavior During Therapy [facility] for tasks assessed/performed      Past Medical History:  Diagnosis Date  . Borderline diabetes   . Thyroid disease   . Vitamin D deficiency     Past Surgical History:  Procedure Laterality Date  . ABDOMINAL HYSTERECTOMY      There were no vitals filed for this visit.      Subjective Assessment - 02/17/16 1524    Subjective Feels like if she steps wrong the pain is exacerbated and "shoots up" - had some thigh numbness after getting out of car yesterday - took approx 30 min to resolve.    Pertinent History borderline DM, endometrial cancer (not active)   Limitations Lifting;Standing;Walking;Sitting;House hold activities   Diagnostic tests x ray: ?L hip avulsion fx   Patient Stated Goals improve pain, back to work full duty   Currently in Pain? Yes   Pain Score 6    Pain Location Hip   Pain Orientation Left   Pain Descriptors / Indicators Aching   Pain Type Acute pain   Pain Radiating Towards mid thigh to low back on L side   Pain Onset 1 to 4 weeks ago   Pain Frequency Constant   Aggravating Factors  prolonged standing and sitting, lifting   Pain Relieving Factors sitting, rest, muscle relaxer                         [facility] Adult PT Treatment/Exercise - 02/17/16 1529      Knee/Hip Exercises: Stretches   Passive Hamstring Stretch Left;3  reps;30 seconds     Knee/Hip Exercises: Aerobic   Nustep L5 x 8 minutes      Knee/Hip Exercises: Machines for Strengthening   Cybex Knee Extension 35#; 2 x 15   Cybex Knee Flexion 25#; 2 x 15     Knee/Hip Exercises: Standing   Side Lunges Limitations side stepping 2 x 20 feet with red tband at ankles   Extension Limitations backwards stepping x 20 feet with red tband at ankles    Lateral Step Up Left;15 reps;Step Height: 6"   Forward Step Up Left;15 reps;Step Height: 6"  pain with 8 inches   Functional Squat 10 reps;2 sets   Functional Squat Limitations TRX     Knee/Hip Exercises: Supine   Bridges Both;15 reps   Bridges Limitations bridges with B LE extended on peanut ball x 15      Knee/Hip Exercises: Sidelying   Hip ABduction Left;2 sets;15 reps   Clams L x 15 with red tband     Modalities   Modalities Electrical Stimulation;Moist Heat     Moist Heat Therapy   Number Minutes Moist Heat 15 Minutes   Moist Heat Location Hip     Electrical Stimulation   Electrical Stimulation Location L buttock   Electrical  Stimulation Action IFC x 15 minutes   Electrical Stimulation Parameters to tolerance   Electrical Stimulation Goals Pain                     PT Long Term Goals - 02/09/16 1538      PT LONG TERM GOAL #1   Title indpendent with HEP (03/15/16)   Time 6   Period Weeks   Status On-going     PT LONG TERM GOAL #2   Title report ability to sit and stand > 30 min for improved work tolerance and function (03/15/16)   Time 6   Period Weeks   Status On-going     PT LONG TERM GOAL #3   Title improve lumbar flexion to > 45 degrees without increase in pain for improved function (03/15/16)   Time 6   Period Weeks   Status On-going     PT LONG TERM GOAL #4   Title verbalize understanding of posture/body mechanics to decrease risk of reinjury (03/15/16)   Time 6   Period Weeks   Status On-going               Plan - 02/17/16 1529    Clinical  Impression Statement Patient continuing to be limited at home and work due to pain, however, able to progress hip strengthening tasks well today. Patient with most pain during forward stepping onto high surfaces. patient to conitnue to benefit from skilled PT intervention to maximize function.    PT Treatment/Interventions ADLs/Self Care Home Management;Cryotherapy;Electrical Stimulation;Iontophoresis 69m/ml Dexamethasone;Moist Heat;Ultrasound;Traction;Therapeutic exercise;Therapeutic activities;Functional mobility training;Stair training;Gait training;Patient/family education;Manual techniques   PT Next Visit Plan progress hip strengthening to tolerance   Consulted and Agree with Plan of Care Patient      Patient will benefit from skilled therapeutic intervention in order to improve the following deficits and impairments:  Pain, Difficulty walking, Decreased mobility, Decreased strength, Decreased range of motion, Postural dysfunction, Impaired flexibility, Obesity, Decreased activity tolerance  Visit Diagnosis: Acute left-sided low back pain without sciatica  Abnormal posture  Muscle weakness (generalized)     Problem List Patient Active Problem List   Diagnosis Date Noted  . Low back pain radiating to left leg 01/28/2016  . Neck pain 01/28/2016  . [place]ometrial [place] 04/19/2012       SLanney Gins PT, DPT 02/17/16 5:15 PM   PHYSICAL THERAPY DISCHARGE SUMMARY  Visits from Start of Care: 4  Current functional level related to goals / functional outcomes: See above   Remaining deficits: See above; goals not met, pain, reduced ability to return to full work duties   Education / Equipment: HEP  Plan: Patient agrees to discharge.  Patient goals were not met. Patient is being discharged due to not returning since the last visit.  ?????    Patient not returning to PT after last visit on 02/17/16. Chart-review revealed MD requesting hold of PT until after MRI  results were obtained. Will happily see patient if warranted by MD and new order is written.  SLanney Gins PT, DPT 04/18/16 10:07 AM   [facility] [place] [address]Phone: [phone]  Fax:  [phone] Name: PMekhi LascolaMRN: [id number] of Birth: [birth year]

## 2016-02-18 ENCOUNTER — Ambulatory Visit: Payer: BLUE CROSS/BLUE SHIELD | Admitting: Physical Therapy

## 2016-02-23 ENCOUNTER — Ambulatory Visit: Payer: BLUE CROSS/BLUE SHIELD | Admitting: Physical Therapy

## 2016-03-01 ENCOUNTER — Ambulatory Visit: Payer: BLUE CROSS/BLUE SHIELD | Admitting: Family Medicine

## 2016-03-01 ENCOUNTER — Ambulatory Visit: Payer: BLUE CROSS/BLUE SHIELD | Admitting: Physical Therapy

## 2016-03-02 ENCOUNTER — Ambulatory Visit: Payer: BLUE CROSS/BLUE SHIELD | Admitting: Family Medicine

## 2016-03-15 ENCOUNTER — Ambulatory Visit: Payer: BLUE CROSS/BLUE SHIELD | Admitting: Family Medicine

## 2016-03-17 ENCOUNTER — Ambulatory Visit (INDEPENDENT_AMBULATORY_CARE_PROVIDER_SITE_OTHER): Payer: BLUE CROSS/BLUE SHIELD | Admitting: Family Medicine

## 2016-03-17 ENCOUNTER — Encounter: Payer: Self-pay | Admitting: Family Medicine

## 2016-03-17 DIAGNOSIS — M545 Low back pain, unspecified: Secondary | ICD-10-CM

## 2016-03-17 DIAGNOSIS — M79605 Pain in left leg: Principal | ICD-10-CM

## 2016-03-17 NOTE — Patient Instructions (Signed)
Continue with the physical therapy and home exercises. We will go ahead with an MRI of your lumbar spine. I will call you with results and next steps the day following this.
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
## 2016-03-23 ENCOUNTER — Telehealth: Payer: Self-pay | Admitting: Family Medicine

## 2016-03-23 NOTE — Addendum Note (Signed)
Addended by: Sherrie George F on: 03/23/2016 10:27 AM   Modules accepted: Orders

## 2016-03-23 NOTE — Telephone Encounter (Signed)
Spoke to patient and told her that radiology would contact her to set up appointment. Also told her that we are holding off on PT until get results back from MRI.

## 2016-03-24 ENCOUNTER — Ambulatory Visit (HOSPITAL_BASED_OUTPATIENT_CLINIC_OR_DEPARTMENT_OTHER)
Admission: RE | Admit: 2016-03-24 | Discharge: 2016-03-24 | Disposition: A | Discharge status: Home / Self Care | Payer: BLUE CROSS/BLUE SHIELD | Source: Ambulatory Visit | Attending: Family Medicine | Admitting: Family Medicine

## 2016-03-24 DIAGNOSIS — M79605 Pain in left leg: Secondary | ICD-10-CM

## 2016-03-24 DIAGNOSIS — Q7649 Other congenital malformations of spine, not associated with scoliosis: Secondary | ICD-10-CM | POA: Diagnosis not present

## 2016-03-24 DIAGNOSIS — M545 Low back pain, unspecified: Secondary | ICD-10-CM

## 2016-04-05 ENCOUNTER — Telehealth: Payer: Self-pay | Admitting: Family Medicine

## 2016-04-05 ENCOUNTER — Other Ambulatory Visit: Payer: Self-pay | Admitting: Family Medicine

## 2016-04-05 DIAGNOSIS — M5416 Radiculopathy, lumbar region: Secondary | ICD-10-CM

## 2016-04-05 NOTE — Telephone Encounter (Signed)
Note written

## 2016-04-15 ENCOUNTER — Inpatient Hospital Stay
Admission: RE | Admit: 2016-04-15 | Discharge: 2016-04-15 | Disposition: A | Discharge status: Home / Self Care | Payer: BLUE CROSS/BLUE SHIELD | Source: Ambulatory Visit | Attending: Family Medicine | Admitting: Family Medicine

## 2016-04-15 NOTE — Discharge Instructions (Signed)

## 2016-04-28 ENCOUNTER — Inpatient Hospital Stay
Admission: RE | Admit: 2016-04-28 | Discharge: 2016-04-28 | Disposition: A | Discharge status: Home / Self Care | Payer: BLUE CROSS/BLUE SHIELD | Source: Ambulatory Visit | Attending: Family Medicine | Admitting: Family Medicine

## 2016-04-28 NOTE — Discharge Instructions (Signed)

## 2016-04-29 ENCOUNTER — Ambulatory Visit
Admission: RE | Admit: 2016-04-29 | Discharge: 2016-04-29 | Disposition: A | Discharge status: Home / Self Care | Payer: BLUE CROSS/BLUE SHIELD | Source: Ambulatory Visit | Attending: Family Medicine | Admitting: Family Medicine

## 2016-04-29 DIAGNOSIS — M5416 Radiculopathy, lumbar region: Secondary | ICD-10-CM

## 2016-04-29 MED ORDER — IOPAMIDOL (ISOVUE-M 200) INJECTION 41%
1.0000 mL | Freq: Once | INTRAMUSCULAR | Status: AC
  Administered 2016-04-29: 1 mL via EPIDURAL

## 2016-04-29 MED ORDER — METHYLPREDNISOLONE ACETATE 40 MG/ML INJ SUSP (RADIOLOG
120.0000 mg | Freq: Once | INTRAMUSCULAR | Status: AC
  Administered 2016-04-29: 120 mg via EPIDURAL

## 2016-04-29 NOTE — Discharge Instructions (Signed)

## 2016-05-09 ENCOUNTER — Telehealth: Payer: Self-pay | Admitting: Family Medicine

## 2016-05-09 NOTE — Telephone Encounter (Signed)
If she got some relief I would consider a second injection.  If it did nothing at all, I would recommend referral to neurosurgery.

## 2016-05-09 NOTE — Telephone Encounter (Signed)
Spoke to patient and she did not get any relief from the injection. Would like to consult with neurosurgeon. Will put in referral.

## 2016-05-09 NOTE — Telephone Encounter (Signed)
Ok, thanks.

## 2016-05-30 ENCOUNTER — Emergency Department (HOSPITAL_BASED_OUTPATIENT_CLINIC_OR_DEPARTMENT_OTHER)
Admission: EM | Admit: 2016-05-30 | Discharge: 2016-05-30 | Disposition: A | Discharge status: Home / Self Care | Payer: BLUE CROSS/BLUE SHIELD | Attending: Emergency Medicine | Admitting: Emergency Medicine

## 2016-05-30 ENCOUNTER — Emergency Department (HOSPITAL_BASED_OUTPATIENT_CLINIC_OR_DEPARTMENT_OTHER): Payer: BLUE CROSS/BLUE SHIELD

## 2016-05-30 ENCOUNTER — Encounter (HOSPITAL_BASED_OUTPATIENT_CLINIC_OR_DEPARTMENT_OTHER): Payer: Self-pay | Admitting: Emergency Medicine

## 2016-05-30 DIAGNOSIS — E1165 Type 2 diabetes mellitus with hyperglycemia: Secondary | ICD-10-CM | POA: Insufficient documentation

## 2016-05-30 DIAGNOSIS — J40 Bronchitis, not specified as acute or chronic: Secondary | ICD-10-CM | POA: Diagnosis not present

## 2016-05-30 DIAGNOSIS — Z8542 Personal history of malignant neoplasm of other parts of uterus: Secondary | ICD-10-CM | POA: Insufficient documentation

## 2016-05-30 DIAGNOSIS — R05 Cough: Secondary | ICD-10-CM | POA: Diagnosis present

## 2016-05-30 DIAGNOSIS — R091 Pleurisy: Secondary | ICD-10-CM | POA: Diagnosis not present

## 2016-05-30 DIAGNOSIS — R739 Hyperglycemia, unspecified: Secondary | ICD-10-CM

## 2016-05-30 HISTORY — DX: Type 2 diabetes mellitus without complications: E11.9

## 2016-05-30 LAB — BASIC METABOLIC PANEL
Anion gap: 8 (ref 5–15)
BUN: 17 mg/dL (ref 6–20)
CHLORIDE: 100 mmol/L — AB (ref 101–111)
CO2: 28 mmol/L (ref 22–32)
Calcium: 9 mg/dL (ref 8.9–10.3)
Creatinine, Ser: 0.91 mg/dL (ref 0.44–1.00)
GFR calc Af Amer: >60 mL/min (ref >60)
GFR calc non Af Amer: >60 mL/min (ref >60)
GLUCOSE: 208 mg/dL — AB (ref 65–99)
Potassium: 3.9 mmol/L (ref 3.5–5.1)
Sodium: 136 mmol/L (ref 135–145)

## 2016-05-30 LAB — TROPONIN I: Troponin I: <0.03 ng/mL (ref <0.03)

## 2016-05-30 LAB — CBC WITH DIFFERENTIAL/PLATELET
BASOS ABS: 0 10*3/uL (ref 0.0–0.1)
Basophils Relative: 0 %
EOS PCT: 0 %
Eosinophils Absolute: 0 10*3/uL (ref 0.0–0.7)
HEMATOCRIT: 36.1 % (ref 36.0–46.0)
Hemoglobin: 11.5 g/dL — ABNORMAL LOW (ref 12.0–15.0)
Lymphocytes Relative: 30 %
Lymphs Abs: 2.9 10*3/uL (ref 0.7–4.0)
MCH: 25.7 pg — AB (ref 26.0–34.0)
MCHC: 31.9 g/dL (ref 30.0–36.0)
MCV: 80.6 fL (ref 78.0–100.0)
MONOS PCT: 12 %
Monocytes Absolute: 1.1 10*3/uL — ABNORMAL HIGH (ref 0.1–1.0)
Neutro Abs: 5.5 10*3/uL (ref 1.7–7.7)
Neutrophils Relative %: 58 %
Platelets: 389 10*3/uL (ref 150–400)
RBC: 4.48 MIL/uL (ref 3.87–5.11)
RDW: 16 % — ABNORMAL HIGH (ref 11.5–15.5)
WBC: 9.5 10*3/uL (ref 4.0–10.5)

## 2016-05-30 LAB — D-DIMER, QUANTITATIVE: D-Dimer, Quant: <0.27 ug/mL-FEU (ref 0.00–0.50)

## 2016-05-30 MED ORDER — HYDROCODONE-HOMATROPINE 5-1.5 MG/5ML PO SYRP: 5.0000 mL | ORAL_SOLUTION | Freq: Four times a day (QID) | ORAL | 0 refills | Status: AC | PRN

## 2016-05-30 MED ORDER — HYDROCOD POLST-CPM POLST ER 10-8 MG/5ML PO SUER
5.0000 mL | Freq: Once | ORAL | Status: AC
  Administered 2016-05-30: 5 mL via ORAL
  Filled 2016-05-30: qty 5

## 2016-05-30 NOTE — ED Triage Notes (Signed)
Pt having cough, runny nose, aches and pains, fever, chest tightness since for three weeks.  Pt seen at PCP on Friday.  Pt given medications for possible pneumonia. But xray was negative.

## 2016-05-30 NOTE — ED Provider Notes (Signed)
[facility] Provider Note   CSN: [id number] Arrival date & time: 05/30/16  1849  By signing my name below, I, Katie Lynn, attest that this documentation has been prepared under the direction and in the presence of Katie Brackin, PA-C. Electronically Signed: Gwenlyn Lynn, ED Scribe. 05/30/16. 8:24 PM.  History   Chief Complaint Chief Complaint  Patient presents with  . Cough  . Chest Pain   The history is provided by the patient. No language interpreter was used.   HPI Comments: Katie Lynn is a 36 y.o. female with PMHx of Asthma and DM who presents to the Emergency Department complaining of gradual onset, persistent, moderate productive cough for 3 weeks. She was seen by her PCP 3 days ago and had a Chest XR that was negative for Pneumonia and was given injections of Z-pak and Prednisone 3 days ago. Pt has been taking Tylenol PM, Zithromax, and prednisone with no significant relief. Pt reports associated left sided chest pain, rhinorrhea, fever, generalized body aches. Chest pain began 2 days ago and is brought on by cough and exacerbated with deep inhalation. Body aches are exacerbated with movement. Her PCP advised her to be seen by a Pulmonologist before using an inhaler. Pt denies rash.  Past Medical History:  Diagnosis Date  . Borderline diabetes   . Diabetes mellitus without complication ([place])   . Thyroid disease   . Vitamin D deficiency     Patient Active Problem List   Diagnosis Date Noted  . Low back pain radiating to left leg 01/28/2016  . Neck pain 01/28/2016  . Endometrial cancer ([place]) 04/19/2012    Past Surgical History:  Procedure Laterality Date  . ABDOMINAL HYSTERECTOMY      OB History    No data available       Home Medications    Prior to Admission medications   Medication Sig Start Date End Date Taking? Authorizing Provider  azithromycin (ZITHROMAX) 250 MG tablet Take 250 mg by mouth daily. 05/27/16 05/31/16 Yes Historical  Provider, MD  predniSONE (DELTASONE) 2.5 MG tablet Take 2.5 mg by mouth daily with breakfast. unknown dosage   Yes Historical Provider, MD  Vitamin D, Ergocalciferol, (DRISDOL) 50000 UNITS CAPS capsule Take 50,000 Units by mouth every 7 (seven) days.    Historical Provider, MD    Family History No family history on file.  Social History Social History  Substance Use Topics  . Smoking status: Never Smoker  . Smokeless tobacco: Never Used  . Alcohol use No     Allergies   Sulfa antibiotics   Review of Systems Review of Systems  Constitutional: Positive for fever.  HENT: Positive for rhinorrhea.   Respiratory: Positive for cough and shortness of breath.   Cardiovascular: Positive for chest pain.  Musculoskeletal:       +Generalized body aches  Skin: Negative for rash.     Physical Exam Updated Vital Signs BP 128/81   Pulse 77   Temp 98.3 F (36.8 C) (Oral)   Resp 18   Ht 5' (1.524 m)   Wt 300 lb (136.1 kg)   SpO2 97%   BMI 58.59 kg/m   Physical Exam  Constitutional: She is oriented to person, place, and time. She appears well-developed and well-nourished. She is active. No distress.  HENT:  Head: Normocephalic and atraumatic.  Right Ear: External ear normal.  Left Ear: External ear normal.  Mouth/Throat: Oropharynx is clear and moist. No oropharyngeal exudate.  Nasal congestion present  Eyes:  Conjunctivae are normal.  Neck: Normal range of motion. Neck supple.  Cardiovascular: Normal rate, regular rhythm, normal heart sounds and intact distal pulses.   Pulmonary/Chest: Effort normal and breath sounds normal. No respiratory distress. She has no wheezes. She has no rales. She exhibits tenderness.  Left anterior chest wall tenderness  Musculoskeletal: Normal range of motion.  Neurological: She is alert and oriented to person, place, and time.  Skin: Skin is warm and dry.  Psychiatric: She has a normal mood and affect. Her behavior is normal.  Nursing note and  vitals reviewed.    ED Treatments / Results  DIAGNOSTIC STUDIES: Oxygen Saturation is 97% on RA, normal by my interpretation.    COORDINATION OF CARE: 8:20 PM Discussed treatment plan with pt at bedside which includes D-dimer, BMP, Troponin, DG Chest, and CBC with differential and pt agreed to plan.  Labs (all labs ordered are listed, but only abnormal results are displayed) Labs Reviewed  CBC WITH DIFFERENTIAL/PLATELET - Abnormal; Notable for the following:       Result Value   Hemoglobin 11.5 (*)    MCH 25.7 (*)    RDW 16.0 (*)    Monocytes Absolute 1.1 (*)    All other components within normal limits  BASIC METABOLIC PANEL - Abnormal; Notable for the following:    Chloride 100 (*)    Glucose, Bld 208 (*)    All other components within normal limits  D-DIMER, QUANTITATIVE (NOT AT [facility])  TROPONIN I    EKG  EKG Interpretation None       Radiology Dg Chest 2 View  Result Date: 05/30/2016 CLINICAL DATA:  Coughing congestion.  Fever and body aches. EXAM: CHEST  2 VIEW COMPARISON:  10/14/2013 FINDINGS: The heart size and mediastinal contours are within normal limits. Both lungs are clear. The visualized skeletal structures are unremarkable. IMPRESSION: No active cardiopulmonary disease. Electronically Signed   By: Misty Stanley M.D.   On: 05/30/2016 20:52    Procedures Procedures (including critical care time)  Medications Ordered in ED Medications - No data to display   Initial Impression / Assessment and Plan / ED Course  I have reviewed the triage vital signs and the nursing notes.  Pertinent labs & imaging results that were available during my care of the patient were reviewed by me and considered in my medical decision making (see chart for details).   patient in emergency department with persistent cough for now 3 weeks. She was placed on Z-Pak, prednisone 3 days ago, and states she is getting worse and now having left-sided pleuritic chest pain. Will repeat  chest x-ray to rule out developing pneumonia. Will get d-dimer given pleuritic chest pain or shortness of breath. We'll get basic labs. I will try Tussionex for her symptoms here.   Patient's chest x-ray is negative. Her labs are unremarkable and glucose of 208, could be elevated due to steroids. Troponin negative. D-dimer is negative. Most likely muscular pain from coughing versus pleurisy. Will discharge home with cough suppressant and pain medication. Vital signs are normal here. Patient is not hypoxic. At home . Follow-up with family doctor. Continue current medications.  Vitals:   05/30/16 1902 05/30/16 2121  BP: 128/81 136/91  Pulse: 77 65  Resp: 18 16  Temp: 98.3 F (36.8 C)   TempSrc: Oral   SpO2: 97% 97%  Weight: 136.1 kg   Height: 5' (1.524 m)     I personally performed the services described in this documentation, which was  scribed in my presence. The recorded information has been reviewed and is accurate.   Final Clinical Impressions(s) / ED Diagnoses   Final diagnoses:  None    New Prescriptions New Prescriptions   No medications on file     Jeannett Senior, PA-C 05/31/16 0004    Merrily Pew, MD 05/31/16 1233

## 2016-05-30 NOTE — Discharge Instructions (Signed)
Continue all current medications. Take hycodan as prescribed as needed for pain and cough. Follow up with family doctor in 2 days if not improving.

## 2016-06-01 ENCOUNTER — Emergency Department (HOSPITAL_BASED_OUTPATIENT_CLINIC_OR_DEPARTMENT_OTHER)
Admission: EM | Admit: 2016-06-01 | Discharge: 2016-06-01 | Disposition: A | Discharge status: Home / Self Care | Payer: BLUE CROSS/BLUE SHIELD | Attending: Physician Assistant | Admitting: Physician Assistant

## 2016-06-01 ENCOUNTER — Emergency Department (HOSPITAL_BASED_OUTPATIENT_CLINIC_OR_DEPARTMENT_OTHER): Payer: BLUE CROSS/BLUE SHIELD

## 2016-06-01 ENCOUNTER — Encounter (HOSPITAL_BASED_OUTPATIENT_CLINIC_OR_DEPARTMENT_OTHER): Payer: Self-pay | Admitting: Emergency Medicine

## 2016-06-01 DIAGNOSIS — E119 Type 2 diabetes mellitus without complications: Secondary | ICD-10-CM | POA: Diagnosis not present

## 2016-06-01 DIAGNOSIS — J4520 Mild intermittent asthma, uncomplicated: Secondary | ICD-10-CM | POA: Diagnosis not present

## 2016-06-01 DIAGNOSIS — R05 Cough: Secondary | ICD-10-CM | POA: Diagnosis present

## 2016-06-01 DIAGNOSIS — R0789 Other chest pain: Secondary | ICD-10-CM

## 2016-06-01 MED ORDER — BENZONATATE 100 MG PO CAPS: 200.0000 mg | ORAL_CAPSULE | Freq: Two times a day (BID) | ORAL | 0 refills | Status: AC | PRN

## 2016-06-01 MED ORDER — PREDNISONE 20 MG PO TABS: 40.0000 mg | ORAL_TABLET | Freq: Every day | ORAL | 0 refills | Status: AC

## 2016-06-01 MED ORDER — AEROCHAMBER PLUS FLO-VU LARGE MISC
1.0000 | Freq: Once | Status: AC
  Administered 2016-06-01: 1
  Filled 2016-06-01: qty 1

## 2016-06-01 MED ORDER — ALBUTEROL SULFATE HFA 108 (90 BASE) MCG/ACT IN AERS
1.0000 | INHALATION_SPRAY | Freq: Once | RESPIRATORY_TRACT | Status: AC
  Administered 2016-06-01: 1 via RESPIRATORY_TRACT
  Filled 2016-06-01: qty 6.7

## 2016-06-01 MED ORDER — KETOROLAC TROMETHAMINE 30 MG/ML IJ SOLN
30.0000 mg | Freq: Once | INTRAMUSCULAR | Status: AC
  Administered 2016-06-01: 30 mg via INTRAMUSCULAR
  Filled 2016-06-01: qty 1

## 2016-06-01 MED ORDER — NAPROXEN 500 MG PO TABS: 500.0000 mg | ORAL_TABLET | Freq: Two times a day (BID) | ORAL | 0 refills | Status: AC

## 2016-06-01 MED ORDER — IPRATROPIUM-ALBUTEROL 0.5-2.5 (3) MG/3ML IN SOLN
RESPIRATORY_TRACT | Status: AC
  Administered 2016-06-01: 9 mL
  Filled 2016-06-01: qty 9

## 2016-06-01 NOTE — ED Triage Notes (Signed)
Patient states she "can't move her left side". Patient reports she was diagnosed with pneumonia "the other day", was given cough medication that she took at 3pm "and it's working so I might 'go out on ya' ". Patient states today she coughed, something popped and now her left side hurts (when asked to clarify she points at her ribs). Patient falling asleep in triage.

## 2016-06-01 NOTE — Discharge Instructions (Signed)
Take your medications as prescribed until completed. I recommend using your albuterol inhaler with a spacer as prescribed as an for shortness of breath/wheezing. I recommend following up with your primary care provider within the next 4-5 days for follow-up evaluation regarding your chest wall pain and wheezing/asthma. Please return to the Emergency Department if symptoms worsen or new onset of fever, dizziness, coughing up blood, difficulty breathing, new/worsening chest pain, abdominal pain, vomiting, numbness, leg swelling, weakness.

## 2016-06-01 NOTE — ED Provider Notes (Signed)
[facility]PT [facility] Provider Note   CSN: [id number] Arrival date & time: 06/01/16  1601   By signing my name below, I, Charolotte Eke, attest that this documentation has been prepared under the direction and in the presence of Harlene Ramus, PA-C. Electronically Signed: Charolotte Eke, Scribe. 06/01/16. 5:26 PM.    History   Chief Complaint Chief Complaint  Patient presents with  . rib pain after coughing    HPI Katie Lynn is a 36 y.o. female with h/o DM who presents to the Emergency Department complaining of constant sharp left sided chest pain that began 4 weeks ago s/p coughing. Patient reports during a coughing episode earlier this afternoon she began to feel a severe sharp pain in her left chest which was worse than the pain she has been having over the past few weeks. Patient reports pain is worse with movement. Patient states she was initially seen by her PCP last Friday and was given a Z-Pak and steroids for her symptoms. She reports worsening of symptoms over the past few days and notes she was also re-seen in the ED 2 days ago. Pt has just finished z-pack. Pt was given cough medication from [facility] 2 days ago which she has been taking without relief. Pt has associated fever TMAX 101 yesterday, rhinorrhea, wheezing, generalized body aches, worsening cough producing phlegm. Pt denies h/o heart disease. [facility]CP [facility] at [facility]. Pt has not been hospitalized recently. Pt denies sore throat, hemoptysis, shortness of breath, abdominal pain, emesis, nausea, leg swelling.    The history is provided by the patient. No language interpreter was used.    Past Medical History:  Diagnosis Date  . Borderline diabetes   . Diabetes mellitus without complication ([place])   . Thyroid disease   . Vitamin D deficiency     Patient Active Problem List   Diagnosis Date Noted  . Low back pain radiating to left leg 01/28/2016  . Neck pain 01/28/2016  . Endometrial cancer ([place]) 04/19/2012    Past  Surgical History:  Procedure Laterality Date  . ABDOMINAL HYSTERECTOMY      OB History    No data available       Home Medications    Prior to Admission medications   Medication Sig Start Date End Date Taking? Authorizing Provider  HYDROcodone-homatropine (HYCODAN) 5-1.5 MG/5ML syrup Take 5 mLs by mouth every 6 (six) hours as needed for cough. 05/30/16  Yes Tatyana Kirichenko, PA-C  Vitamin D, Ergocalciferol, (DRISDOL) 50000 UNITS CAPS capsule Take 50,000 Units by mouth every 7 (seven) days.   Yes Historical Provider, MD  benzonatate (TESSALON) 100 MG capsule Take 2 capsules (200 mg total) by mouth 2 (two) times daily as needed for cough. 06/01/16   Nona Dell, PA-C  naproxen (NAPROSYN) 500 MG tablet Take 1 tablet (500 mg total) by mouth 2 (two) times daily. 06/01/16   Nona Dell, PA-C  predniSONE (DELTASONE) 20 MG tablet Take 2 tablets (40 mg total) by mouth daily. 06/01/16   Nona Dell, PA-C    Family History History reviewed. No pertinent family history.  Social History Social History  Substance Use Topics  . Smoking status: Never Smoker  . Smokeless tobacco: Never Used  . Alcohol use No     Allergies   Sulfa antibiotics   Review of Systems Review of Systems  Constitutional: Positive for fever.  HENT: Positive for rhinorrhea.   Respiratory: Positive for cough, shortness of breath and wheezing.   Cardiovascular: Positive for  chest pain.  Musculoskeletal: Positive for arthralgias and myalgias.  All other systems reviewed and are negative.    Physical Exam Updated Vital Signs BP 116/58 (BP Location: Right Wrist)   Pulse 87   Temp 97.9 F (36.6 C) (Oral)   Resp 20   Ht 5' (1.524 m)   Wt 136.1 kg   SpO2 96%   BMI 58.59 kg/m   Physical Exam  Constitutional: She is oriented to person, place, and time. She appears well-developed and well-nourished. No distress.  HENT:  Head: Normocephalic and atraumatic.  Mouth/Throat:  Uvula is midline, oropharynx is clear and moist and mucous membranes are normal. No oropharyngeal exudate, posterior oropharyngeal edema, posterior oropharyngeal erythema or tonsillar abscesses. No tonsillar exudate.  Eyes: Conjunctivae and EOM are normal. Right eye exhibits no discharge. Left eye exhibits no discharge. No scleral icterus.  Neck: Normal range of motion. Neck supple.  Cardiovascular: Normal rate, regular rhythm, normal heart sounds and intact distal pulses.   Pulmonary/Chest: Effort normal. No respiratory distress. She has wheezes. She has no rales. She exhibits tenderness. She exhibits no laceration, no crepitus, no edema, no deformity, no swelling and no retraction.  Diffuse expiratory wheezing, without increased work of breathing. TTP over left anterior and lateral chest wall.   Abdominal: Soft. Bowel sounds are normal. She exhibits no distension and no mass. There is no tenderness. There is no rebound and no guarding.  Musculoskeletal: She exhibits no edema.  Neurological: She is alert and oriented to person, place, and time.  Skin: Skin is warm and dry. She is not diaphoretic.  Nursing note and vitals reviewed.    ED Treatments / Results   DIAGNOSTIC STUDIES: Oxygen Saturation is 94% on room air, low by my interpretation.    COORDINATION OF CARE: 5:15 PM Discussed treatment plan with pt at bedside and pt agreed to plan, which includes looking at her most recent Xr. Pt will get new XR of chest or ribs.  6:44 PM Recheck pt and discussed treatment plan.  Labs (all labs ordered are listed, but only abnormal results are displayed) Labs Reviewed - No data to display  EKG  EKG Interpretation None       Radiology Dg Chest 2 View  Result Date: 05/30/2016 CLINICAL DATA:  Coughing congestion.  Fever and body aches. EXAM: CHEST  2 VIEW COMPARISON:  10/14/2013 FINDINGS: The heart size and mediastinal contours are within normal limits. Both lungs are clear. The  visualized skeletal structures are unremarkable. IMPRESSION: No active cardiopulmonary disease. Electronically Signed   By: Misty Stanley M.D.   On: 05/30/2016 20:52   Dg Ribs Unilateral W/chest Left  Result Date: 06/01/2016 CLINICAL DATA:  Cough and left rib pain. EXAM: LEFT RIBS AND CHEST - 3+ VIEW COMPARISON:  Chest x-ray 05/30/2016 FINDINGS: The cardiac silhouette, mediastinal and hilar contours are within normal limits and stable. The lungs are clear. No pleural effusion or pneumothorax. The bony thorax is intact. No definite rib fractures. IMPRESSION: No acute cardiopulmonary findings. No definite acute left-sided rib fractures. Electronically Signed   By: Marijo Sanes M.D.   On: 06/01/2016 17:54    Procedures Procedures (including critical care time)  Medications Ordered in ED Medications  albuterol (PROVENTIL HFA;VENTOLIN HFA) 108 (90 Base) MCG/ACT inhaler 1 puff (not administered)  AEROCHAMBER PLUS FLO-VU LARGE MISC 1 each (not administered)  ipratropium-albuterol (DUONEB) 0.5-2.5 (3) MG/3ML nebulizer solution (9 mLs  Given 06/01/16 1655)  ketorolac (TORADOL) 30 MG/ML injection 30 mg (30 mg Intramuscular Given  06/01/16 1908)     Initial Impression / Assessment and Plan / ED Course  I have reviewed the triage vital signs and the nursing notes.  Pertinent labs & imaging results that were available during my care of the patient were reviewed by me and considered in my medical decision making (see chart for details).     Patient presents with left chest wall pain which have been present for the past few weeks with associated cough, wheezing. Pain significantly worsened with coughing this afternoon. She reports her symptoms have worsened over the past week. She notes she was initially evaluated by her PCP on Friday and started on a Z-Pak and prednisone without relief. Patient was reevaluated in the ED on 2/26. Negative chest x-ray and d-dimer. Patient's chest pain thought to be muscular  from coughing, discharged home with cough suppressant and pain medication. VSS. Exam revealed diffuse x-ray wheezing throughout without evidence of increased work of breathing or rest for distress. Tenderness over left anterior and lateral chest wall without evidence of deformity. Remaining exam unremarkable. Patient given neb treatment in the ED. Left rib/chest x-ray negative. On reevaluation patient with significant improvement of wheezing throughout. Suspect patient's symptoms are likely musculoskeletal in etiology secondary to coughing associated with asthma exacerbation. Plan to discharge patient home with continued steroids, albuterol inhaler and antitussive. Advised patient to follow up with her PCP for further management and evaluation of her asthma and chest wall pain. Discussed return precautions.  Final Clinical Impressions(s) / ED Diagnoses   Final diagnoses:  Chest wall pain  Mild intermittent asthma without complication    New Prescriptions New Prescriptions   BENZONATATE (TESSALON) 100 MG CAPSULE    Take 2 capsules (200 mg total) by mouth 2 (two) times daily as needed for cough.   NAPROXEN (NAPROSYN) 500 MG TABLET    Take 1 tablet (500 mg total) by mouth 2 (two) times daily.   PREDNISONE (DELTASONE) 20 MG TABLET    Take 2 tablets (40 mg total) by mouth daily.   I personally performed the services described in this documentation, which was scribed in my presence. The recorded information has been reviewed and is accurate.     Chesley Noon [place], [place] 06/01/16 [place], MD 06/01/16 [phone]

## 2016-06-01 NOTE — ED Notes (Signed)
Pt states that she wants staff to "keep me in the hospital until I get well, so I wont have to keep coming back."
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
## 2024-02-22 ENCOUNTER — Emergency Department (HOSPITAL_BASED_OUTPATIENT_CLINIC_OR_DEPARTMENT_OTHER)
Admission: EM | Admit: 2024-02-22 | Discharge: 2024-02-22 | Disposition: A | Discharge status: Home / Self Care | Attending: Emergency Medicine | Admitting: Emergency Medicine

## 2024-02-22 ENCOUNTER — Encounter (HOSPITAL_BASED_OUTPATIENT_CLINIC_OR_DEPARTMENT_OTHER): Payer: Self-pay | Admitting: Urology

## 2024-02-22 ENCOUNTER — Other Ambulatory Visit: Payer: Self-pay

## 2024-02-22 ENCOUNTER — Emergency Department (HOSPITAL_BASED_OUTPATIENT_CLINIC_OR_DEPARTMENT_OTHER)

## 2024-02-22 DIAGNOSIS — M7989 Other specified soft tissue disorders: Secondary | ICD-10-CM | POA: Diagnosis not present

## 2024-02-22 DIAGNOSIS — M25561 Pain in right knee: Secondary | ICD-10-CM | POA: Diagnosis present

## 2024-02-22 DIAGNOSIS — Z79899 Other long term (current) drug therapy: Secondary | ICD-10-CM | POA: Insufficient documentation

## 2024-02-22 NOTE — ED Provider Notes (Signed)
 [place] [facility] AT [facility] Provider Note   CSN: [id number] Arrival date & time: 02/22/24  1604     Patient presents with: Knee Pain   Lasasha Brophy is a 43 y.o. female.   43 year old female presents with 2 weeks of right knee pain and swelling.  States 2 weeks ago she missed stepped and heard a pop.  Since then she has had pain worse with ambulation.  No other injuries.  Has not seen and 1 prior to coming in.  Has not taken any medications.  The history is provided by the patient. No language interpreter was used.       Prior to Admission medications   Medication Sig Start Date End Date Taking? Authorizing Provider  benzonatate  (TESSALON ) 100 MG capsule Take 2 capsules (200 mg total) by mouth 2 (two) times daily as needed for cough. 06/01/16   Nevelyn Nat Norris, PA-C  HYDROcodone -homatropine (HYCODAN) 5-1.5 MG/5ML syrup Take 5 mLs by mouth every 6 (six) hours as needed for cough. 05/30/16   Kirichenko, Tatyana, PA-C  naproxen  (NAPROSYN ) 500 MG tablet Take 1 tablet (500 mg total) by mouth 2 (two) times daily. 06/01/16   Nevelyn Nat Norris, PA-C  predniSONE  (DELTASONE ) 20 MG tablet Take 2 tablets (40 mg total) by mouth daily. 06/01/16   Nevelyn Nat Norris, PA-C  Vitamin D, Ergocalciferol, (DRISDOL) 50000 UNITS CAPS capsule Take 50,000 Units by mouth every 7 (seven) days.    [provider]    Allergies: Sulfa antibiotics    Review of Systems  Constitutional:  Negative for chills and fever.  Musculoskeletal:  Positive for arthralgias and joint swelling.  Neurological:  Negative for light-headedness.  All other systems reviewed and are negative.   Updated Vital Signs BP (!) 157/81 (BP Location: Right Arm)   Pulse 81   Temp 97.7 F (36.5 C)   Resp 16   Ht 5' (1.524 m)   Wt (!) 136.1 kg   SpO2 96%   BMI 58.60 kg/m   Physical Exam Vitals and nursing note reviewed.  Constitutional:      General: She is not in  acute distress.    Appearance: Normal appearance. She is not ill-appearing.  HENT:     Head: Normocephalic and atraumatic.     Nose: Nose normal.  Eyes:     Conjunctiva/sclera: Conjunctivae normal.  Cardiovascular:     Rate and Rhythm: Normal rate and regular rhythm.  Pulmonary:     Effort: Pulmonary effort is normal. No respiratory distress.  Musculoskeletal:        General: No deformity.     Comments: Right knee with point tenderness over the posterior right aspect of the joint.  Neurovascularly intact.  No deformity.  No significant swelling.  Able to bear weight but has some discomfort.  Skin:    Findings: No rash.  Neurological:     Mental Status: She is alert.     (all labs ordered are listed, but only abnormal results are displayed) Labs Reviewed - No data to display  EKG: None  Radiology: DG Knee Complete 4 Views Right Result Date: 02/22/2024 EXAM: 4 OR MORE VIEW(S) XRAY OF THE RIGHT KNEE 02/22/2024 04:35:22 PM COMPARISON: None available. CLINICAL HISTORY: knee pain x 2 weeks FINDINGS: BONES AND JOINTS: No acute fracture. No focal osseous lesion. No joint dislocation. Possible mild suprapatellar joint effusion probable loose body seen posteriorly in joint. SOFT TISSUES: The soft tissues are unremarkable. IMPRESSION: 1. Possible mild suprapatellar joint effusion.  2. Probable posterior intra-articular loose body. Electronically signed by: Lynwood Seip MD 02/22/2024 04:44 PM EST RP Workstation: [id number]     Procedures   Medications Ordered in the ED - No data to display                                  Medical Decision Making Amount and/or Complexity of Data Reviewed Radiology: ordered.   43 year old female presents today for concern of right knee pain.  She states she heard a pop when she missed stepped.  Has been having pain since specially with bearing weight. Point tenderness over the right posterior knee joint.  Neurovascularly intact.  Ortho referral given  for follow-up. Crutches and knee immobilizer given.    Final diagnoses:  Acute pain of right knee    ED Discharge Orders     None          Hildegard Loge, PA-C 02/22/24 1940    Patt Alm Macho, MD 02/23/24 1451

## 2024-02-22 NOTE — ED Triage Notes (Signed)
 Pt ambulatory to triage  States right knee pain x 2 weeks  No injury noted  Pain with weight bearing

## 2024-02-22 NOTE — Discharge Instructions (Addendum)
 Your x-ray showed a loose body in the back of your knee joint.  This could be a fracture since you misstep and heard a pop and have been having significant pain especially when I pressed on the back of your knee joint over the spot.  Follow-up with the sports medicine doctor.  Take 600 mg of ibuprofen  3 times a day for the next 5 days. I have given you Dr. Littie information.  He is our sports medicine doctor upstairs.  If he is unable to see you you can call our on-call orthopedist Dr. Barton whose information is also included.

## 2024-04-16 ENCOUNTER — Other Ambulatory Visit: Payer: Self-pay

## 2024-04-16 ENCOUNTER — Encounter (HOSPITAL_COMMUNITY): Payer: Self-pay

## 2024-04-16 ENCOUNTER — Emergency Department (HOSPITAL_COMMUNITY)

## 2024-04-16 ENCOUNTER — Emergency Department (HOSPITAL_COMMUNITY)
Admission: EM | Admit: 2024-04-16 | Discharge: 2024-04-16 | Disposition: A | Discharge status: Home / Self Care | Attending: Emergency Medicine | Admitting: Emergency Medicine

## 2024-04-16 DIAGNOSIS — R42 Dizziness and giddiness: Secondary | ICD-10-CM | POA: Diagnosis present

## 2024-04-16 DIAGNOSIS — H6692 Otitis media, unspecified, left ear: Secondary | ICD-10-CM | POA: Diagnosis not present

## 2024-04-16 DIAGNOSIS — D649 Anemia, unspecified: Secondary | ICD-10-CM | POA: Diagnosis not present

## 2024-04-16 DIAGNOSIS — H6690 Otitis media, unspecified, unspecified ear: Secondary | ICD-10-CM

## 2024-04-16 LAB — I-STAT CHEM 8, ED
BUN: 17 mg/dL (ref 6–20)
Calcium, Ion: 1.14 mmol/L — ABNORMAL LOW (ref 1.15–1.40)
Chloride: 102 mmol/L (ref 98–111)
Creatinine, Ser: 1 mg/dL (ref 0.44–1.00)
Glucose, Bld: 98 mg/dL (ref 70–99)
HCT: 36 % (ref 36.0–46.0)
Hemoglobin: 12.2 g/dL (ref 12.0–15.0)
Potassium: 3.5 mmol/L (ref 3.5–5.1)
Sodium: 141 mmol/L (ref 135–145)
TCO2: 28 mmol/L (ref 22–32)

## 2024-04-16 LAB — CBC WITH DIFFERENTIAL/PLATELET
Abs Immature Granulocytes: 0.01 K/uL (ref 0.00–0.07)
Basophils Absolute: 0 K/uL (ref 0.0–0.1)
Basophils Relative: 0 %
Eosinophils Absolute: 0.1 K/uL (ref 0.0–0.5)
Eosinophils Relative: 1 %
HCT: 37.5 % (ref 36.0–46.0)
Hemoglobin: 11.3 g/dL — ABNORMAL LOW (ref 12.0–15.0)
Immature Granulocytes: 0 %
Lymphocytes Relative: 44 %
Lymphs Abs: 4.2 K/uL — ABNORMAL HIGH (ref 0.7–4.0)
MCH: 25.4 pg — ABNORMAL LOW (ref 26.0–34.0)
MCHC: 30.1 g/dL (ref 30.0–36.0)
MCV: 84.3 fL (ref 80.0–100.0)
Monocytes Absolute: 0.6 K/uL (ref 0.1–1.0)
Monocytes Relative: 6 %
Neutro Abs: 4.5 K/uL (ref 1.7–7.7)
Neutrophils Relative %: 49 %
Platelets: 376 K/uL (ref 150–400)
RBC: 4.45 MIL/uL (ref 3.87–5.11)
RDW: 14.7 % (ref 11.5–15.5)
WBC: 9.4 K/uL (ref 4.0–10.5)
nRBC: 0 % (ref 0.0–0.2)

## 2024-04-16 LAB — BASIC METABOLIC PANEL WITH GFR
Anion gap: 11 (ref 5–15)
BUN: 16 mg/dL (ref 6–20)
CO2: 28 mmol/L (ref 22–32)
Calcium: 9.5 mg/dL (ref 8.9–10.3)
Chloride: 102 mmol/L (ref 98–111)
Creatinine, Ser: 0.83 mg/dL (ref 0.44–1.00)
GFR, Estimated: >60 mL/min
Glucose, Bld: 97 mg/dL (ref 70–99)
Potassium: 3.8 mmol/L (ref 3.5–5.1)
Sodium: 141 mmol/L (ref 135–145)

## 2024-04-16 LAB — HCG, SERUM, QUALITATIVE: Preg, Serum: NEGATIVE

## 2024-04-16 MED ORDER — MECLIZINE HCL 25 MG PO TABS
25.0000 mg | ORAL_TABLET | Freq: Once | ORAL | Status: AC
  Administered 2024-04-16: 25 mg via ORAL
  Filled 2024-04-16: qty 1

## 2024-04-16 MED ORDER — AMOXICILLIN 500 MG PO CAPS
1000.0000 mg | ORAL_CAPSULE | Freq: Once | ORAL | Status: AC
  Administered 2024-04-16: 1000 mg via ORAL
  Filled 2024-04-16: qty 2

## 2024-04-16 MED ORDER — MECLIZINE HCL 25 MG PO TABS: 25.0000 mg | ORAL_TABLET | Freq: Three times a day (TID) | ORAL | 0 refills | Status: AC | PRN

## 2024-04-16 MED ORDER — AMOXICILLIN 500 MG PO CAPS: 500.0000 mg | ORAL_CAPSULE | Freq: Three times a day (TID) | ORAL | 0 refills | Status: AC

## 2024-04-16 NOTE — Discharge Instructions (Addendum)
 Today you were seen for dizziness.  You were found to have a left inner ear infection which I suspect is causing your symptoms.  Please pick up your medications and take as prescribed.  Please follow-up with Dr. Luciano with ENT if your symptoms persist for further evaluation and workup.  Thank you for letting us  treat you today. After reviewing your labs and imaging, I feel you are safe to go home. Please follow up with your PCP in the next several days and provide them with your records from this visit. Return to the Emergency Room if pain becomes severe or symptoms worsen.

## 2024-04-16 NOTE — ED Provider Triage Note (Signed)
 Emergency Medicine Provider Triage Evaluation Note  Bethan Adamek , a 44 y.o. female  was evaluated in triage.  Pt complains of dizziness.  EMS reports acute onset dizziness and ear ringing.  Patient symptoms are worse when she turns her head to a certain position.  She denies prior history of same.  Review of Systems  Positive: Dizziness Negative: Chest pain, shortness of breath, vomiting, focal weakness, visual change  Physical Exam  BP (!) 165/100   Pulse 68   Temp 98.3 F (36.8 C) (Oral)   Resp 16   Ht 5' (1.524 m)   Wt 134.7 kg   SpO2 100%   BMI 58.00 kg/m  Gen:   Awake, no distress   Resp:  Normal effort  MSK:   Moves extremities without difficulty  Other:    Medical Decision Making  Medically screening exam initiated at 4:14 PM.  Appropriate orders placed.  Kharis Lapenna was informed that the remainder of the evaluation will be completed by another provider, this initial triage assessment does not replace that evaluation, and the importance of remaining in the ED until their evaluation is complete.     Laurice Maude BROCKS, MD 04/16/24 [phone]

## 2024-04-16 NOTE — ED Provider Notes (Signed)
 " [place] [facility] AT [facility] Provider Note   CSN: [id number] Arrival date & time: 04/16/24  1413     Patient presents with: Dizziness   Katie Lynn is a 44 y.o. female. presents to the ED with dizziness. Patient reports experiencing dizziness for the past few days with worsening symptoms today. Patient describes the dizziness as room spinning. Patient states she had a brief episode of ear ringing. No current ringing. Patient denies headache, confusion, fevers, chills, trauma, numbness, weakness, rashes, loss of consciousness. Patient endorses left sided ear discomfort without drainage.    Dizziness Associated symptoms: tinnitus        Prior to Admission medications  Medication Sig Start Date End Date Taking? Authorizing Provider  amoxicillin  (AMOXIL ) 500 MG capsule Take 1 capsule (500 mg total) by mouth 3 (three) times daily. 04/16/24  Yes Aviana Shevlin N, PA-C  meclizine  (ANTIVERT ) 25 MG tablet Take 1 tablet (25 mg total) by mouth 3 (three) times daily as needed for dizziness. 04/16/24  Yes Jamahl Lemmons N, PA-C  benzonatate  (TESSALON ) 100 MG capsule Take 2 capsules (200 mg total) by mouth 2 (two) times daily as needed for cough. 06/01/16   Nevelyn Nat Norris, PA-C  HYDROcodone -homatropine (HYCODAN) 5-1.5 MG/5ML syrup Take 5 mLs by mouth every 6 (six) hours as needed for cough. 05/30/16   Kirichenko, Tatyana, PA-C  naproxen  (NAPROSYN ) 500 MG tablet Take 1 tablet (500 mg total) by mouth 2 (two) times daily. 06/01/16   Nevelyn Nat Norris, PA-C  predniSONE  (DELTASONE ) 20 MG tablet Take 2 tablets (40 mg total) by mouth daily. 06/01/16   Nevelyn Nat Norris, PA-C  Vitamin D, Ergocalciferol, (DRISDOL) 50000 UNITS CAPS capsule Take 50,000 Units by mouth every 7 (seven) days.    [provider]    Allergies: Sulfa antibiotics    Review of Systems  HENT:  Positive for ear pain and tinnitus.   Neurological:  Positive for dizziness.     Updated Vital Signs BP (!) 154/95 (BP Location: Left Arm)   Pulse 81   Temp 98.1 F (36.7 C)   Resp (!) 22   Ht 5' (1.524 m)   Wt 134.7 kg   SpO2 97%   BMI 58.00 kg/m   Physical Exam Constitutional:      Appearance: Normal appearance.  HENT:     Head: Normocephalic.     Right Ear: Tympanic membrane normal.     Left Ear: Tympanic membrane is erythematous and bulging.     Ears:     Comments: Positive left sided Trenda Craze with horizontal nystagmus. Normal left sided [employer].    Nose: Nose normal.     Mouth/Throat:     Mouth: Mucous membranes are moist.  Eyes:     Pupils: Pupils are equal, round, and reactive to light.  Cardiovascular:     Rate and Rhythm: Normal rate and regular rhythm.  Pulmonary:     Effort: Pulmonary effort is normal.  Musculoskeletal:     Cervical back: Normal range of motion.  Neurological:     General: No focal deficit present.     Mental Status: She is alert. Mental status is at baseline.     GCS: GCS eye subscore is 4. GCS verbal subscore is 5. GCS motor subscore is 6.  Psychiatric:        Mood and Affect: Mood normal.     (all labs ordered are listed, but only abnormal results are displayed) Labs Reviewed  CBC WITH  DIFFERENTIAL/PLATELET - Abnormal; Notable for the following components:      Result Value   Hemoglobin 11.3 (*)    MCH 25.4 (*)    Lymphs Abs 4.2 (*)    All other components within normal limits  I-STAT CHEM 8, ED - Abnormal; Notable for the following components:   Calcium, Ion 1.14 (*)    All other components within normal limits  BASIC METABOLIC PANEL WITH GFR  HCG, SERUM, QUALITATIVE    EKG: EKG Interpretation Date/Time:  Tuesday April 16 2024 16:41:27 EST Ventricular Rate:  69 PR Interval:  152 QRS Duration:  86 QT Interval:  414 QTC Calculation: 443 R Axis:   12  Text Interpretation: Normal sinus rhythm Normal ECG When compared with ECG of 30-May-2016 19:02, PREVIOUS ECG IS PRESENT Confirmed by  Cottie Cough [phone]) on 04/16/2024 10:13:07 PM  Radiology: CT Head Wo Contrast Result Date: 04/16/2024 EXAM: CT HEAD WITHOUT CONTRAST 04/16/2024 04:57:55 PM TECHNIQUE: CT of the head was performed without the administration of intravenous contrast. Automated exposure control, iterative reconstruction, and/or weight based adjustment of the mA/kV was utilized to reduce the radiation dose to as low as reasonably achievable. COMPARISON: None available. CLINICAL HISTORY: Vertigo, peripheral. The patient presents with peripheral vertigo. FINDINGS: BRAIN AND VENTRICLES: No acute hemorrhage. No evidence of acute infarct. No hydrocephalus. No extra-axial collection. No mass effect or midline shift. ORBITS: No acute abnormality. SINUSES: No acute abnormality. SOFT TISSUES AND SKULL: No acute soft tissue abnormality. No skull fracture. IMPRESSION: 1. No acute intracranial abnormality. Electronically signed by: Morgane Naveau MD 04/16/2024 05:04 PM EST RP Workstation: [id number]     Procedures   Medications Ordered in the ED  amoxicillin  (AMOXIL ) capsule 1,000 mg (has no administration in time range)  meclizine  (ANTIVERT ) tablet 25 mg (25 mg Oral Given 04/16/24 1634)                                    Medical Decision Making  This patient presents to the ED for concern of dizziness differential diagnosis includes BPPV, labyrinthitis, vestibular neuritis, Ramsay Hunt syndrome, acute otitis media, MS, migraine, stroke   Lab Tests:  I Ordered, and personally interpreted labs.  The pertinent results include: Negative pregnancy, mild anemia 11.3, BMP unremarkable   Imaging Studies ordered:  I ordered imaging studies including CT head Noncon I independently visualized and interpreted imaging which showed no acute intracranial abnormality I agree with the radiologist interpretation EKG: Normal sinus rhythm   Medicines ordered and prescription drug management:  I ordered medication including  Antivert  and amoxicillin     I have reviewed the patients home medicines and have made adjustments as needed   Problem List / ED Course:  Considered for admission or further workup however patient's vital signs, physical exam, labs, and imaging are reassuring.  Patient has no red flag signs or symptoms concerning for a central cause of her symptoms.  Patient has acute otitis media of the left ear.  Patient given outpatient course of amoxicillin  and Antivert  for symptomatic management.  Patient to follow-up with ENT if her symptoms persist for further evaluation workup.  I feel patient safe for discharge at this time.       Final diagnoses:  Dizziness  Acute otitis media, unspecified otitis media type    ED Discharge Orders          Ordered    amoxicillin  (AMOXIL ) 500 MG capsule  3 times daily        04/16/24 2231    meclizine  (ANTIVERT ) 25 MG tablet  3 times daily PRN        04/16/24 2231               Francis Ileana SAILOR, PA-C 04/16/24 2305    Cottie Donnice PARAS, MD 04/16/24 2314  "

## 2024-04-16 NOTE — ED Triage Notes (Signed)
 Patient BIB [facility] from work for dizziness that started a few days ago and worsened today. EMS reports nystagmus while turning her head, she reports room feels like it's spinning. Patient reports her ear was ringing when it happened but doesn't remember which one.  BP 162/97 HR 78 96% RA CBG 97 RR 18
